# Patient Record
Sex: Male | Born: 1935 | Race: Black or African American | Hispanic: No | Smoking: Former smoker
Health system: Southern US, Community
[De-identification: ages and names within clinical notes are randomized; demographics above are authoritative.]

## PROBLEM LIST (undated history)

## (undated) DIAGNOSIS — E785 Hyperlipidemia, unspecified: Secondary | ICD-10-CM

## (undated) DIAGNOSIS — E119 Type 2 diabetes mellitus without complications: Secondary | ICD-10-CM

## (undated) DIAGNOSIS — I5032 Chronic diastolic (congestive) heart failure: Secondary | ICD-10-CM

## (undated) DIAGNOSIS — I10 Essential (primary) hypertension: Secondary | ICD-10-CM

## (undated) DIAGNOSIS — N183 Chronic kidney disease, stage 3 unspecified: Secondary | ICD-10-CM

## (undated) DIAGNOSIS — I739 Peripheral vascular disease, unspecified: Secondary | ICD-10-CM

## (undated) DIAGNOSIS — C140 Malignant neoplasm of pharynx, unspecified: Secondary | ICD-10-CM

## (undated) DIAGNOSIS — I2510 Atherosclerotic heart disease of native coronary artery without angina pectoris: Secondary | ICD-10-CM

## (undated) HISTORY — DX: Peripheral vascular disease, unspecified: I73.9

## (undated) HISTORY — DX: Type 2 diabetes mellitus without complications: E11.9

## (undated) HISTORY — DX: Essential (primary) hypertension: I10

## (undated) HISTORY — PX: THROAT SURGERY: SHX803

## (undated) HISTORY — DX: Malignant neoplasm of pharynx, unspecified: C14.0

## (undated) HISTORY — DX: Atherosclerotic heart disease of native coronary artery without angina pectoris: I25.10

## (undated) HISTORY — DX: Hyperlipidemia, unspecified: E78.5

---

## 2002-02-01 ENCOUNTER — Encounter: Payer: Self-pay | Admitting: *Deleted

## 2002-02-01 ENCOUNTER — Ambulatory Visit (HOSPITAL_COMMUNITY): Admission: RE | Admit: 2002-02-01 | Discharge: 2002-02-01 | Payer: Self-pay | Admitting: Family Medicine

## 2002-02-01 ENCOUNTER — Inpatient Hospital Stay (HOSPITAL_COMMUNITY): Admission: EM | Admit: 2002-02-01 | Discharge: 2002-02-15 | Payer: Self-pay | Admitting: *Deleted

## 2002-02-01 ENCOUNTER — Encounter: Payer: Self-pay | Admitting: Family Medicine

## 2002-02-03 ENCOUNTER — Encounter: Payer: Self-pay | Admitting: Family Medicine

## 2002-02-07 ENCOUNTER — Encounter: Payer: Self-pay | Admitting: General Surgery

## 2002-02-12 ENCOUNTER — Encounter: Payer: Self-pay | Admitting: General Surgery

## 2002-09-26 ENCOUNTER — Ambulatory Visit (HOSPITAL_COMMUNITY): Admission: RE | Admit: 2002-09-26 | Discharge: 2002-09-26 | Payer: Self-pay | Admitting: Family Medicine

## 2002-09-26 ENCOUNTER — Encounter: Payer: Self-pay | Admitting: Family Medicine

## 2002-09-27 ENCOUNTER — Encounter (HOSPITAL_COMMUNITY): Admission: RE | Admit: 2002-09-27 | Discharge: 2002-10-27 | Payer: Self-pay | Admitting: Family Medicine

## 2002-09-29 ENCOUNTER — Encounter: Payer: Self-pay | Admitting: Vascular Surgery

## 2002-10-03 ENCOUNTER — Inpatient Hospital Stay (HOSPITAL_COMMUNITY): Admission: RE | Admit: 2002-10-03 | Discharge: 2002-10-04 | Payer: Self-pay | Admitting: Vascular Surgery

## 2002-10-03 ENCOUNTER — Encounter (INDEPENDENT_AMBULATORY_CARE_PROVIDER_SITE_OTHER): Payer: Self-pay | Admitting: *Deleted

## 2002-11-03 ENCOUNTER — Ambulatory Visit (HOSPITAL_COMMUNITY): Admission: RE | Admit: 2002-11-03 | Discharge: 2002-11-03 | Payer: Self-pay | Admitting: Family Medicine

## 2003-03-22 ENCOUNTER — Ambulatory Visit (HOSPITAL_COMMUNITY): Admission: RE | Admit: 2003-03-22 | Discharge: 2003-03-22 | Payer: Self-pay | Admitting: *Deleted

## 2004-05-09 ENCOUNTER — Inpatient Hospital Stay (HOSPITAL_COMMUNITY): Admission: EM | Admit: 2004-05-09 | Discharge: 2004-05-10 | Payer: Self-pay | Admitting: Internal Medicine

## 2004-05-09 ENCOUNTER — Ambulatory Visit: Payer: Self-pay | Admitting: Cardiology

## 2004-05-09 ENCOUNTER — Emergency Department (HOSPITAL_COMMUNITY): Admission: EM | Admit: 2004-05-09 | Discharge: 2004-05-09 | Payer: Self-pay | Admitting: Emergency Medicine

## 2004-05-14 ENCOUNTER — Ambulatory Visit: Payer: Self-pay

## 2004-05-29 ENCOUNTER — Ambulatory Visit: Payer: Self-pay | Admitting: *Deleted

## 2005-04-02 ENCOUNTER — Ambulatory Visit: Payer: Self-pay | Admitting: *Deleted

## 2005-07-21 ENCOUNTER — Encounter (INDEPENDENT_AMBULATORY_CARE_PROVIDER_SITE_OTHER): Payer: Self-pay | Admitting: *Deleted

## 2005-07-21 LAB — CONVERTED CEMR LAB
Cholesterol: 123 mg/dL
HDL: 32 mg/dL
Triglycerides: 82 mg/dL

## 2006-12-08 ENCOUNTER — Ambulatory Visit: Payer: Self-pay | Admitting: Cardiovascular Disease

## 2006-12-09 ENCOUNTER — Ambulatory Visit (HOSPITAL_COMMUNITY): Admission: RE | Admit: 2006-12-09 | Discharge: 2006-12-09 | Payer: Self-pay | Admitting: Cardiovascular Disease

## 2008-06-13 ENCOUNTER — Ambulatory Visit: Payer: Self-pay | Admitting: Cardiology

## 2008-06-13 ENCOUNTER — Encounter (INDEPENDENT_AMBULATORY_CARE_PROVIDER_SITE_OTHER): Payer: Self-pay | Admitting: *Deleted

## 2008-06-13 ENCOUNTER — Inpatient Hospital Stay (HOSPITAL_COMMUNITY): Admission: AD | Admit: 2008-06-13 | Discharge: 2008-06-15 | Payer: Self-pay | Admitting: Family Medicine

## 2008-06-13 LAB — CONVERTED CEMR LAB: Hgb A1c MFr Bld: 11.6 %

## 2008-10-31 ENCOUNTER — Inpatient Hospital Stay (HOSPITAL_COMMUNITY): Admission: AD | Admit: 2008-10-31 | Discharge: 2008-11-02 | Payer: Self-pay | Admitting: Family Medicine

## 2008-10-31 ENCOUNTER — Ambulatory Visit: Payer: Self-pay | Admitting: Cardiology

## 2008-10-31 ENCOUNTER — Encounter (INDEPENDENT_AMBULATORY_CARE_PROVIDER_SITE_OTHER): Payer: Self-pay | Admitting: *Deleted

## 2008-10-31 LAB — CONVERTED CEMR LAB
BUN: 22 mg/dL
CO2: 32 meq/L
Calcium: 10.4 mg/dL
Chloride: 103 meq/L
Creatinine, Ser: 1.09 mg/dL
GFR calc non Af Amer: >60 mL/min
Glomerular Filtration Rate, Af Am: >60 mL/min/{1.73_m2}
Glucose, Bld: 102 mg/dL
HCT: 43 %
Hemoglobin: 5.14 g/dL
INR: 0.9
MCV: 83.5 fL
Platelets: 14 10*3/uL
Potassium: 4.2 meq/L
Prothrombin Time: 12.7 s
Sodium: 139 meq/L
WBC: 8 10*3/uL

## 2008-11-01 ENCOUNTER — Other Ambulatory Visit: Payer: Self-pay | Admitting: Cardiology

## 2008-11-01 ENCOUNTER — Encounter (INDEPENDENT_AMBULATORY_CARE_PROVIDER_SITE_OTHER): Payer: Self-pay | Admitting: *Deleted

## 2008-11-01 ENCOUNTER — Ambulatory Visit: Payer: Self-pay | Admitting: Cardiology

## 2008-11-01 ENCOUNTER — Encounter: Payer: Self-pay | Admitting: Cardiology

## 2008-11-01 LAB — CONVERTED CEMR LAB
HCT: 38 %
Hemoglobin: 13.4 g/dL
MCV: 83 fL
WBC: 7.4 10*3/uL
aPTT: 35 s

## 2008-11-02 ENCOUNTER — Other Ambulatory Visit: Payer: Self-pay | Admitting: Cardiology

## 2008-11-02 ENCOUNTER — Encounter (INDEPENDENT_AMBULATORY_CARE_PROVIDER_SITE_OTHER): Payer: Self-pay | Admitting: *Deleted

## 2008-11-02 LAB — CONVERTED CEMR LAB
BUN: 15 mg/dL
CO2: 29 meq/L
Calcium: 9.1 mg/dL
Chloride: 105 meq/L
Creatinine, Ser: 1.17 mg/dL
GFR calc non Af Amer: >60 mL/min
Glomerular Filtration Rate, Af Am: >60 mL/min/{1.73_m2}
Glucose, Bld: 57 mg/dL
HCT: 39.9 %
Hemoglobin: 13.5 g/dL
INR: 1
MCV: 84.3 fL
Platelets: 154 10*3/uL
Potassium: 3.4 meq/L
Prothrombin Time: 13.5 s
Sodium: 140 meq/L
WBC: 7.8 10*3/uL
aPTT: 38 s

## 2008-11-27 ENCOUNTER — Encounter (HOSPITAL_COMMUNITY): Admission: RE | Admit: 2008-11-27 | Discharge: 2008-12-27 | Payer: Self-pay | Admitting: Cardiology

## 2008-12-01 ENCOUNTER — Telehealth: Payer: Self-pay | Admitting: Cardiology

## 2008-12-05 ENCOUNTER — Encounter: Payer: Self-pay | Admitting: Cardiology

## 2008-12-18 ENCOUNTER — Telehealth: Payer: Self-pay | Admitting: Cardiology

## 2008-12-27 ENCOUNTER — Encounter (HOSPITAL_COMMUNITY): Admission: RE | Admit: 2008-12-27 | Discharge: 2009-01-18 | Payer: Self-pay | Admitting: Cardiovascular Disease

## 2008-12-28 ENCOUNTER — Ambulatory Visit: Payer: Self-pay | Admitting: Cardiovascular Disease

## 2008-12-29 ENCOUNTER — Encounter: Payer: Self-pay | Admitting: Cardiology

## 2009-01-08 ENCOUNTER — Encounter: Payer: Self-pay | Admitting: Cardiology

## 2009-01-09 ENCOUNTER — Ambulatory Visit (HOSPITAL_COMMUNITY): Admission: RE | Admit: 2009-01-09 | Discharge: 2009-01-09 | Payer: Self-pay | Admitting: Cardiovascular Disease

## 2009-01-11 ENCOUNTER — Ambulatory Visit: Payer: Self-pay | Admitting: Cardiology

## 2009-01-13 ENCOUNTER — Encounter: Payer: Self-pay | Admitting: Cardiology

## 2009-01-13 LAB — CONVERTED CEMR LAB
Cholesterol: 133 mg/dL (ref 0–200)
HDL: 35 mg/dL — ABNORMAL LOW (ref >39)
LDL Cholesterol: 80 mg/dL (ref 0–99)
Total CHOL/HDL Ratio: 3.8
Triglycerides: 92 mg/dL (ref <150)
VLDL: 18 mg/dL (ref 0–40)

## 2009-01-16 ENCOUNTER — Encounter (INDEPENDENT_AMBULATORY_CARE_PROVIDER_SITE_OTHER): Payer: Self-pay | Admitting: *Deleted

## 2009-01-19 ENCOUNTER — Encounter (HOSPITAL_COMMUNITY): Admission: RE | Admit: 2009-01-19 | Discharge: 2009-02-18 | Payer: Self-pay | Admitting: Cardiovascular Disease

## 2009-02-19 ENCOUNTER — Encounter (HOSPITAL_COMMUNITY): Admission: RE | Admit: 2009-02-19 | Discharge: 2009-03-21 | Payer: Self-pay | Admitting: Cardiovascular Disease

## 2009-06-12 ENCOUNTER — Ambulatory Visit (HOSPITAL_COMMUNITY): Admission: RE | Admit: 2009-06-12 | Discharge: 2009-06-12 | Payer: Self-pay | Admitting: Family Medicine

## 2009-07-30 DIAGNOSIS — I10 Essential (primary) hypertension: Secondary | ICD-10-CM | POA: Insufficient documentation

## 2009-07-30 DIAGNOSIS — I739 Peripheral vascular disease, unspecified: Secondary | ICD-10-CM

## 2009-07-30 DIAGNOSIS — E119 Type 2 diabetes mellitus without complications: Secondary | ICD-10-CM

## 2009-07-30 DIAGNOSIS — E785 Hyperlipidemia, unspecified: Secondary | ICD-10-CM

## 2009-07-30 DIAGNOSIS — I2510 Atherosclerotic heart disease of native coronary artery without angina pectoris: Secondary | ICD-10-CM | POA: Insufficient documentation

## 2009-07-30 DIAGNOSIS — C329 Malignant neoplasm of larynx, unspecified: Secondary | ICD-10-CM | POA: Insufficient documentation

## 2009-07-31 ENCOUNTER — Encounter (INDEPENDENT_AMBULATORY_CARE_PROVIDER_SITE_OTHER): Payer: Self-pay | Admitting: *Deleted

## 2009-07-31 ENCOUNTER — Ambulatory Visit: Payer: Self-pay | Admitting: Cardiology

## 2009-11-15 ENCOUNTER — Encounter (INDEPENDENT_AMBULATORY_CARE_PROVIDER_SITE_OTHER): Payer: Self-pay | Admitting: *Deleted

## 2009-11-15 LAB — CONVERTED CEMR LAB
ALT: 17 units/L
ALT: 17 units/L
ALT: 17 units/L
AST: 19 units/L
AST: 19 units/L
AST: 19 units/L
Albumin: 4.1 g/dL
Albumin: 4.1 g/dL
Albumin: 4.1 g/dL
Alkaline Phosphatase: 35 units/L
Alkaline Phosphatase: 35 units/L
Alkaline Phosphatase: 35 units/L
Bilirubin, Direct: 0.2 mg/dL
Bilirubin, Direct: 0.2 mg/dL
Bilirubin, Direct: 0.2 mg/dL
Cholesterol: 111 mg/dL
Cholesterol: 111 mg/dL
Cholesterol: 111 mg/dL
HDL: 34 mg/dL
HDL: 34 mg/dL
HDL: 34 mg/dL
LDL Cholesterol: 61 mg/dL
LDL Cholesterol: 61 mg/dL
LDL Cholesterol: 61 mg/dL
Total Protein: 6.4 g/dL
Total Protein: 6.4 g/dL
Total Protein: 6.4 g/dL
Triglycerides: 82 mg/dL
Triglycerides: 82 mg/dL
Triglycerides: 82 mg/dL

## 2010-01-29 ENCOUNTER — Encounter (INDEPENDENT_AMBULATORY_CARE_PROVIDER_SITE_OTHER): Payer: Self-pay | Admitting: *Deleted

## 2010-02-05 ENCOUNTER — Ambulatory Visit: Payer: Self-pay | Admitting: Cardiology

## 2010-02-05 ENCOUNTER — Encounter (INDEPENDENT_AMBULATORY_CARE_PROVIDER_SITE_OTHER): Payer: Self-pay | Admitting: *Deleted

## 2010-02-23 ENCOUNTER — Emergency Department (HOSPITAL_COMMUNITY): Admission: EM | Admit: 2010-02-23 | Discharge: 2010-02-23 | Payer: Self-pay | Admitting: Emergency Medicine

## 2010-03-07 ENCOUNTER — Encounter (INDEPENDENT_AMBULATORY_CARE_PROVIDER_SITE_OTHER): Payer: Self-pay | Admitting: *Deleted

## 2010-03-07 ENCOUNTER — Ambulatory Visit: Payer: Self-pay | Admitting: Cardiology

## 2010-05-06 ENCOUNTER — Encounter (INDEPENDENT_AMBULATORY_CARE_PROVIDER_SITE_OTHER): Payer: Self-pay | Admitting: *Deleted

## 2010-05-09 ENCOUNTER — Encounter (INDEPENDENT_AMBULATORY_CARE_PROVIDER_SITE_OTHER): Payer: Self-pay | Admitting: *Deleted

## 2010-05-09 ENCOUNTER — Ambulatory Visit: Admit: 2010-05-09 | Payer: Self-pay | Admitting: Cardiology

## 2010-05-13 ENCOUNTER — Encounter: Payer: Self-pay | Admitting: Cardiovascular Disease

## 2010-05-14 ENCOUNTER — Ambulatory Visit
Admission: RE | Admit: 2010-05-14 | Discharge: 2010-05-14 | Payer: Self-pay | Source: Home / Self Care | Attending: Cardiology | Admitting: Cardiology

## 2010-05-14 ENCOUNTER — Encounter: Payer: Self-pay | Admitting: Adult Health

## 2010-05-21 ENCOUNTER — Ambulatory Visit
Admission: RE | Admit: 2010-05-21 | Discharge: 2010-05-21 | Payer: Self-pay | Source: Home / Self Care | Attending: Cardiology | Admitting: Cardiology

## 2010-05-21 NOTE — Letter (Signed)
Summary: [place] [facility], [facility] at [employer]  [address] [place] [address]   Phone: [phone]  Fax: [phone]     February 05, 2010 MRN: [id number]   [name] [address] [place], [place]  [address]      YOUR LAB WORK IS DUE   March 11, 2010  Please go to [facility], located across the street from [facility] on the second floor.  Hours are Monday - Friday 7am until 7:30pm         Saturday 8am until 12noon      DO NOT EAT OR DRINK AFTER MIDNIGHT EVENING PRIOR TO LABWORK

## 2010-05-21 NOTE — Miscellaneous (Signed)
Summary: labs lipids,liver,11/15/2009  Clinical Lists Changes  Observations: Added new observation of ALBUMIN: 4.1 g/dL (16/01/9603 54:09) Added new observation of PROTEIN, TOT: 6.4 g/dL (81/19/1478 29:56) Added new observation of SGPT (ALT): 17 units/L (11/15/2009 10:35) Added new observation of SGOT (AST): 19 units/L (11/15/2009 10:35) Added new observation of ALK PHOS: 35 units/L (11/15/2009 10:35) Added new observation of BILI DIRECT: 0.2 mg/dL (21/30/8657 84:69) Added new observation of LDL: 61 mg/dL (62/95/2841 32:44) Added new observation of HDL: 34 mg/dL (04/23/7251 66:44) Added new observation of TRIGLYC TOT: 82 mg/dL (03/47/4259 56:38) Added new observation of CHOLESTEROL: 111 mg/dL (75/64/3329 51:88)

## 2010-05-21 NOTE — Assessment & Plan Note (Signed)
Summary: 6 mth f/u per checkout on 01/11/09/tg   Visit Type:  Follow-up Primary Provider:  Dr. Lubertha South   History of Present Illness: [name] returns to the office for continued assessment and treatment of coronary disease and multiple cardiovascular risk factors.  Since his last appointment, he has done generally well.  He denies dyspnea or chest discomfort.  There has been no orthopnea, PND nor pedal edema.  I note that he has a hoarse voice during this visit.  He reports that that is chronic and intermittent.  He has a history of laryngeal carcinoma treated with radiation therapy, but has not recently been reevaluated by an ENT physician.  He does not take his blood pressure routinely, but occasionally measures it at a drugstore with values typically around 130 systolic.  Clinical Review Panels:  Lab Work HgbA1c 11.6 (06/13/2008) BUN 15 (11/02/2008) Creatinine 1.17 (11/02/2008)  Lipid Levels LDL 80 (01/13/2009) HDL 35 (01/13/2009) Cholesterol 133 (01/13/2009)    Current Medications (verified): 1)  Plavix 75 Mg Tabs (Clopidogrel Bisulfate) .... Take 1 Tab Daily 2)  Simvastatin 40 Mg Tabs (Simvastatin) .... Take 1 Tab Daily 3)  Aspir-Low 81 Mg Tbec (Aspirin) .... Take 1 Tab Daily 4)  Glucophage 500 Mg Tabs (Metformin Hcl) .... Take 2 Tabs Two Times A Day 5)  Glipizide 10 Mg Tabs (Glipizide) .... Take 1 Tab Two Times A Day 6)  Hydrochlorothiazide 25 Mg Tabs (Hydrochlorothiazide) .... Take 1 Tab Daily 7)  Lisinopril 40 Mg Tabs (Lisinopril) .... Take One Tablet By Mouth Daily 8)  Carvedilol 12.5 Mg Tabs (Carvedilol) .... Take 2  Tablet By Mouth Twice A Day  Past History:  PMH, FH, and Social History reviewed and updated.  Review of Systems       The patient complains of hoarseness.  The patient denies weight loss, weight gain, vision loss, decreased hearing, chest pain, syncope, dyspnea on exertion, peripheral edema, prolonged cough, headaches, hemoptysis,  abdominal pain, and melena.    Vital Signs:  Patient profile:   77 year old male Weight:      171 pounds Pulse rate:   81 / minute BP sitting:   185 / 89  (right arm)  Vitals Entered By: Dreama Saa, CNA (July 31, 2009 11:34 AM)  Serial Vital Signs/Assessments:  Time      Position  BP       Pulse  Resp  Temp     By           R Arm     185/90                         Kathlen Brunswick, MD, [facility]           L Arm     170/90                         Kathlen Brunswick, MD, [facility]   Physical Exam  General:    well developed; no acute distress; proportionate weight and height HEENT-poor dentition; hoarse voice   Neck-No JVD; no carotid bruits: Lungs-No tachypnea, no rales; no rhonchi; no wheezes: Cardiovascular-normal PMI; normal S1 and S2; S4 present Abdomen-BS normal; soft and non-tender without masses or organomegaly:  Neurologic-Normal cranial nerves; symmetric strength and tone:  Skin-Warm, no significant lesions: Extremities- distal pulses intact; no edema:     Impression & Recommendations:  Problem # 1:  ATHEROSCLEROTIC CARDIOVASCULAR DISEASE (  ICD-429.2) No symptoms to suggest recurrent myocardial ischemia.  Plavix will be continued for at least another 15 months.  Problem # 2:  HYPERTENSION (ICD-401.9) Blood pressure is now quite high despite a normal value on the same medication 6 months ago.  Patient brought medications with him and he assures me that he has been taking them as ordered.  We will increase the dose of lisinopril and Coreg, have him obtain blood pressures in his drugstore and return in one month for repeat blood pressure check.  Problem # 3:  DIABETES MELLITUS, TYPE II (ICD-250.00) Last lipid profile was good; current medication will be continued.  The most recent A1c level available to me is quite elevated.  Dr. Gerda Diss is managing his diabetic medications.  Problem # 4:  CARCINOMA, LARYNX (ICD-161.9) I suspect that his hoarseness is related to his original  neoplasm and subsequent therapy, but it might be worthwhile to have him reassessed by his ENT specialist.  I will leave that decision to Dr. Gerda Diss and plan to see this nice gentleman again in 6 months.  Patient Instructions: 1)  Your physician recommends that you schedule a follow-up appointment in: 6 MONTHS 2)  Your physician recommends that you return for lab work in: 1 MONTH 3)  Your physician has recommended you make the following change in your medication:  INCREASE CARVEDILOL TO 25MG  TWICE DAILY, INCREASE LISINOPRIL TO 40MG  DAILY 4)  You have been referred to NURSE VISIT IN 1 MONTH 5)  Your physician has requested that you regularly monitor and record your blood pressure readings at home.  Please use the same machine at the same time of day to check your readings and record them to bring to your follow-up visit. CHECK BLOOD PRESSURES AT THE DRUG STORE AND RECORD ON YOUR BLOOD PRESSURE DIARY AND BRING TO NURSE VIST Prescriptions: LISINOPRIL 40 MG TABS (LISINOPRIL) Take one tablet by mouth daily  #30 x 6   Entered by:   Teressa Lower RN   Authorized by:   Kathlen Brunswick, MD, [facility]   Signed by:   Teressa Lower RN on 07/31/2009   Method used:   Electronically to        [facility] [phone]* (retail)       [address]       [place], [place]  [address]       Ph: [phone] or [phone]       Fax: [phone]   RxID:   [id number]

## 2010-05-21 NOTE — Letter (Signed)
Summary: [place] [facility], agricultural at [employer]  [address] [place] [address]   Phone: [phone]  Fax: [phone]     March 07, 2010 MRN: [id number]   Merle Haile [address] [place], [place]  [address]      YOUR LAB WORK IS DUE   April 08, 2010  Please go to [facility], located across the street from [facility] on the second floor.  Hours are Monday - Friday 7am until 7:30pm         Saturday 8am until 12noon    _X_  DO NOT EAT OR DRINK AFTER MIDNIGHT EVENING PRIOR TO LABWORK

## 2010-05-21 NOTE — Assessment & Plan Note (Signed)
Summary: 1 mth nurse visit per checkout on 02/05/10/tg  Nurse Visit   Vital Signs:  Patient profile:   77 year old male Height:      70 inches Weight:      173 pounds O2 Sat:      95 % on Room air Temp:     97.7 degrees F oral Pulse rate:   75 / minute BP sitting:   178 / 82  (right arm)  Vitals Entered By: Teressa Lower RN (March 07, 2010 10:26 AM)  O2 Flow:  Room air  Visit Type:  1 month nurse visit Primary Provider:  Dr. Lubertha South   History of Present Illness: S: 1 month nurse visit B: ov on 02/05/2010, pt started carvedilol 12.5mg  two times a day and was to stop simvastatin and start lipitior A: pt did not stop simvastatin and begin lipitor, I sent new rx for lipitor to [facility], took simvastatin from pt, gave new lab orders for december 19th due to not started meds R: asked pt to return tomorrow on meds for a repeat bp check  Noted.  [name], M.D.    Serial Vital Signs/Assessments:  Time      Position  BP       Pulse  Resp  Temp     By 10:32 AM  R Arm     190/89                         Teressa Lower RN 10:32 AM  L Arm     200/79                         Teressa Lower RN 8:49 AM   R Arm     182/82                         Teressa Lower RN 8:49 AM   L Arm     181/88                         Teressa Lower RN  Comments: pt denies complaints but did not take his am meds By: Teressa Lower RN  8:49 AM [name] came to office 03/08/2010 for a repeat bp check on his medication the readings are as listed without  much improvement, pt continues to deny complaints By: Teressa Lower RN    Current Medications (verified): 1)  Plavix 75 Mg Tabs (Clopidogrel Bisulfate) .... Take 1 Tab Daily 2)  Aspir-Low 81 Mg Tbec (Aspirin) .... Take 1 Tab Daily 3)  Glucophage 500 Mg Tabs (Metformin Hcl) .... Take 2 Tabs Two Times A Day 4)  Glipizide 10 Mg Tabs (Glipizide) .... Take 1 Tab Two Times A Day 5)  Hydrochlorothiazide 25 Mg Tabs (Hydrochlorothiazide) .... Take 1 Tab  Daily 6)  Lisinopril 40 Mg Tabs (Lisinopril) .... Take One Tablet By Mouth Daily 7)  Carvedilol 12.5 Mg Tabs (Carvedilol) .... Take One Tablet By Mouth Twice A Day 8)  Lipitor 40 Mg Tabs (Atorvastatin Calcium) .... Take 1 Tablet By Mouth Once A Day  Allergies (verified): No Known Drug Allergies  Patient Instructions: 1)  Your physician recommends that you schedule a follow-up appointment in: May 09, 2010 11:30AM 2)  Your physician recommends that you return for lab work in: April 08, 2010 3)  Your physician has recommended you make the following change  in your medication:  STOP SIMVASTATIN AND BEGIN LIPITOR DAILY  Prescriptions: LIPITOR 40 MG TABS (ATORVASTATIN CALCIUM) Take 1 tablet by mouth once a day  #30 x 3   Entered by:   Teressa Lower RN   Authorized by:   Kathlen Brunswick, MD, [facility]   Signed by:   Teressa Lower RN on 03/07/2010   Method used:   Electronically to        [facility] [phone]* (retail)       [address]       [place], [place]  [address]       Ph: [phone] or [phone]       Fax: [phone]   RxID:   [id number]

## 2010-05-21 NOTE — Miscellaneous (Signed)
Summary: labs 10/31/08-11/02/2008   Clinical Lists Changes  Observations: Added new observation of CALCIUM: 9.1 mg/dL (96/07/5407 8:11) Added new observation of GFR AA: >60 mL/min/1.35m2 (11/02/2008 8:40) Added new observation of GFR: >60 mL/min (11/02/2008 8:40) Added new observation of CREATININE: 1.17 mg/dL (91/47/8295 6:21) Added new observation of BUN: 15 mg/dL (30/86/5784 6:96) Added new observation of BG RANDOM: 57 mg/dL (29/52/8413 2:44) Added new observation of CO2 PLSM/SER: 29 meq/L (11/02/2008 8:40) Added new observation of CL SERUM: 105 meq/L (11/02/2008 8:40) Added new observation of K SERUM: 3.4 meq/L (11/02/2008 8:40) Added new observation of NA: 140 meq/L (11/02/2008 8:40) Added new observation of PLATELETK/UL: 154 K/uL (11/02/2008 8:40) Added new observation of MCV: 84.3 fL (11/02/2008 8:40) Added new observation of HCT: 39.9 % (11/02/2008 8:40) Added new observation of HGB: 13.5 g/dL (04/23/7251 6:64) Added new observation of WBC COUNT: 7.8 10*3/microliter (11/02/2008 8:40) Added new observation of INR: 1.0  (11/02/2008 8:40) Added new observation of PT PATIENT: 13.5 s (11/02/2008 8:40) Added new observation of PTT PATIENT: 38 s (11/02/2008 8:40) Added new observation of MCV: 83.0 fL (11/01/2008 8:40) Added new observation of HCT: 38.0 % (11/01/2008 8:40) Added new observation of HGB: 13.4 g/dL (40/34/7425 9:56) Added new observation of WBC COUNT: 7.4 10*3/microliter (11/01/2008 8:40) Added new observation of PTT PATIENT: 35 s (11/01/2008 8:40) Added new observation of CALCIUM: 10.4 mg/dL (38/75/6433 2:95) Added new observation of GFR AA: >60 mL/min/1.41m2 (10/31/2008 8:40) Added new observation of GFR: >60 mL/min (10/31/2008 8:40) Added new observation of CREATININE: 1.09 mg/dL (18/84/1660 6:30) Added new observation of BUN: 22 mg/dL (16/04/930 3:55) Added new observation of BG RANDOM: 102 mg/dL (73/22/0254 2:70) Added new observation of CO2 PLSM/SER: 32 meq/L  (10/31/2008 8:40) Added new observation of CL SERUM: 103 meq/L (10/31/2008 8:40) Added new observation of K SERUM: 4.2 meq/L (10/31/2008 8:40) Added new observation of NA: 139 meq/L (10/31/2008 8:40) Added new observation of PLATELETK/UL: 14.0 K/uL (10/31/2008 8:40) Added new observation of MCV: 83.5 fL (10/31/2008 8:40) Added new observation of HCT: 43.0 % (10/31/2008 8:40) Added new observation of HGB: 5.14 g/dL (62/37/6283 1:51) Added new observation of WBC COUNT: 8.0 10*3/microliter (10/31/2008 8:40) Added new observation of INR: 0.9  (10/31/2008 8:40) Added new observation of PT PATIENT: 12.7 s (10/31/2008 8:40) Added new observation of HGBA1C: 11.6 % (06/13/2008 8:40) Added new observation of HDL: 32 mg/dL (76/16/0737 1:06) Added new observation of TRIGLYC TOT: 82 mg/dL (26/94/8546 2:70) Added new observation of CHOLESTEROL: 123 mg/dL (35/00/9381 8:29)

## 2010-05-21 NOTE — Assessment & Plan Note (Signed)
Summary: Z6X   Visit Type:  Follow-up Primary Provider:  Dr. Lubertha South   History of Present Illness: [name] returns to the office for continued assessment and treatment of coronary disease and multiple cardiovascular risk factors.  He continues to do well, especially from a cardiovascular standpoint, with no dyspnea, no chest discomfort, no orthopnea, no PND, no lightheadedness and no syncope.  He has not been hospitalized or required emergency department therapy.  He denies any new medical conditions.  He has had some challenges with medication.  His dose of simvastatin was increased from 40 to 80 mg, but he is taking pills from both bottles resulting in a dose of 120 mg per day.  As a result, his most recent lipid profile was excellent; however, dosage at this level cannot continue.  He has not been monitoring his blood pressure.  Current Medications (verified): 1)  Plavix 75 Mg Tabs (Clopidogrel Bisulfate) .... Take 1 Tab Daily 2)  Aspir-Low 81 Mg Tbec (Aspirin) .... Take 1 Tab Daily 3)  Glucophage 500 Mg Tabs (Metformin Hcl) .... Take 2 Tabs Two Times A Day 4)  Glipizide 10 Mg Tabs (Glipizide) .... Take 1 Tab Two Times A Day 5)  Hydrochlorothiazide 25 Mg Tabs (Hydrochlorothiazide) .... Take 1 Tab Daily 6)  Lisinopril 40 Mg Tabs (Lisinopril) .... Take One Tablet By Mouth Daily 7)  Carvedilol 12.5 Mg Tabs (Carvedilol) .... Take One Tablet By Mouth Twice A Day 8)  Lipitor 40 Mg Tabs (Atorvastatin Calcium) .... Take 1 Tablet By Mouth Once A Day  Allergies (verified): No Known Drug Allergies  Comments:  Nurse/Medical Assistant: patient brought med bottles he is taking simvastatin 40 mg and 80  didn't realize they were the same med called Dr.luking about patients meds per Dr.Steve luking he was increased to 80 mg in july not suppose to be taking both   Past History:  PMH, FH, and Social History reviewed and updated.  Review of Systems       See history of  present illness.  Vital Signs:  Patient profile:   77 year old male Weight:      174 pounds BMI:     25.06 Pulse rate:   89 / minute BP sitting:   186 / 92  (right arm)  Vitals Entered By: Dreama Saa, CNA (February 05, 2010 2:12 PM)  Physical Exam  General:    well developed; no acute distress; proportionate weight and height HEENT-poor dentition   Neck-No JVD; no carotid bruits: Lungs-No tachypnea, no rales; no rhonchi; no wheezes: Cardiovascular-normal PMI; normal S1 and S2; S4 present Abdomen-BS normal; soft and non-tender without masses or organomegaly:  Neurologic-Normal cranial nerves; symmetric strength and tone:  Skin-Warm, no significant lesions: Extremities- distal pulses intact; no edema:     Impression & Recommendations:  Problem # 1:  ATHEROSCLEROTIC CARDIOVASCULAR DISEASE (ICD-429.2) Patient continues to be free of symptoms, suggesting that he is experiencing no myocardial ischemia.  Major direction of his cardiology care will be to optimally control cardiovascular risk factors.  Problem # 2:  HYPERTENSION (ICD-401.9) Blood pressure control is inadequate but has beenat most of his previous office visits.  Carvedilol 12.5 mg b.i.d. will be added to his medical regime with the dosage adjusted upwards as required.  He is also being treated with hydrochlorothiazide and lisinopril.  A fourth drug may well be necessary with a calcium channel antagonists the likely choice.  Problem # 3:  HYPERLIPIDEMIA (ICD-272.4) Continued use of simvastatin would exclude  calcium channel antagonists for treatment of his hypertension.  Accordingly, we will substitute atorvastatin 40 mg q.d. and repeat a lipid profile in one month.  He will be seen by the cardiology nurses in one month and by me in 3 months to verify adequate control of his hypertension.  Other Orders: Future Orders: T-Lipid Profile ([id number]) ... 03/11/2010 T-Comprehensive Metabolic Panel [id number]) ...  03/11/2010  Patient Instructions: 1)  Your physician recommends that you schedule a follow-up appointment in: 3 months 2)  Your physician recommends that you return for lab work in: 1 month 3)  Your physician has recommended you make the following change in your medication: carvedilol 12.5mg  two times a day, stop simvastatin start atorvastatin 40mg  daily 4)  You have been referred to nurse visit in 1 month with labwork  Prescriptions: LIPITOR 40 MG TABS (ATORVASTATIN CALCIUM) Take 1 tablet by mouth once a day  #30 x 3   Entered by:   Teressa Lower RN   Authorized by:   Kathlen Brunswick, MD, [facility]   Signed by:   Teressa Lower RN on 02/05/2010   Method used:   Electronically to        [facility] [phone]* (retail)       [address]       [place], [place]  [address]       Ph: [phone] or [phone]       Fax: [phone]   RxID:   [id number] CARVEDILOL 12.5 MG TABS (CARVEDILOL) Take one tablet by mouth twice a day  #60 x 3   Entered by:   Teressa Lower RN   Authorized by:   Kathlen Brunswick, MD, [facility]   Signed by:   Teressa Lower RN on 02/05/2010   Method used:   Electronically to        [facility] [phone]* (retail)       [address]       [place], [place]  [address]       Ph: [phone] or [phone]       Fax: [phone]   RxID:   [id number]

## 2010-05-23 NOTE — Letter (Signed)
Summary: Appointment - Missed  [facility] [place]  [address], [place] [address]   Phone: [phone]  Fax: [phone]     May 09, 2010 MRN: [id number]   [facility] [address] [place], [place]  [address]   Dear [name],  Our records indicate you missed your appointment on         05/09/10               with Dr.       .    ROTHBART                               It is very important that we reach you to reschedule this appointment. We look forward to participating in your health care needs. Please contact us at the number listed above at your earliest convenience to reschedule this appointment.     Sincerely,    Glass blower/designer

## 2010-05-23 NOTE — Assessment & Plan Note (Signed)
Summary: 3 MTH F/U/TG   Visit Type:  Follow-up Primary Provider:  Dr. Lubertha South   History of Present Illness: 3 mth fu patients bp is 199/99 he states he has took meds this morning but has not eat  This is a 77 year old African American married male patient who returns here for followup of hypertension. His carvedilol was increased back in October by Dr. Dietrich Pates.Followup nurse visit in November showed his blood pressure was still elevated at 180-200 systolic range but the patient had not taken his medications that day.  The patient comes in today without cardiac complaints he denies chest pain, palpitations, dyspnea, dyspnea on exertion, dizziness, or presyncope. He does not have a way to check his blood pressure home. He does admit to eating a lot of salt. He uses a salt shaker and just about everything and needs quite a bit of canned foods.     Current Medications (verified): 1)  Plavix 75 Mg Tabs (Clopidogrel Bisulfate) .... Take 1 Tab Daily 2)  Aspir-Low 81 Mg Tbec (Aspirin) .... Take 1 Tab Daily 3)  Glucophage 500 Mg Tabs (Metformin Hcl) .... Take 2 Tabs Two Times A Day 4)  Glipizide 10 Mg Tabs (Glipizide) .... Take 1 Tab Two Times A Day 5)  Hydrochlorothiazide 25 Mg Tabs (Hydrochlorothiazide) .... Take 1 Tab Daily 6)  Lisinopril 40 Mg Tabs (Lisinopril) .... Take One Tablet By Mouth Daily 7)  Carvedilol 12.5 Mg Tabs (Carvedilol) .... Take One and A Half Tablets By Mouth Twice A Day 8)  Lipitor 40 Mg Tabs (Atorvastatin Calcium) .... Take 1 Tablet By Mouth Once A Day 9)  Norvasc 5 Mg Tabs (Amlodipine Besylate) .... Take 1 Tablet By Mouth Once Daily  Allergies (verified): No Known Drug Allergies  Comments:  Nurse/Medical Assistant: patient brought meds states he takes all his meds as bottle states [facility] [place]  also reviewed from previous ov meds         Past History:  Past Medical History: Last updated: 07/30/2009 ASCVD-DES placed in the RCA in 10/2008;  residual 60% left anterior descending, 50% M1, 50% mid right;           EF-60% with inferior hypokinesisnegative stress nuclear study in 2006 and 05/2008 Cerebrovascular disease: Prior CVA; left carotid endarterectomy in 2004 HYPERTENSION (ICD-401.9) HYPERLIPIDEMIA (ICD-272.4) PVD (ICD-443.9)-ABIs in 12/2008 : Right--0.84; F.--0.82 Tobacco abuse-50 pack years discontinued in 1987 AODM-no insulin 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  Alcohol Use - no Regular Exercise - yes Drug Use - no  Review of Systems       see history of present illness,otherwise negative  Vital Signs:  Patient profile:   77 year old male Weight:      171 pounds BMI:     24.62 Pulse rate:   88 / minute BP sitting:   199 / 99  (left arm)  Vitals Entered By: Dreama Saa, CNA (May 14, 2010 10:32 AM)  Physical Exam  General:   Well-nournished, in no acute distress. Neck: No JVD, HJR, Bruit, or thyroid enlargement Lungs: No tachypnea, clear without wheezing, rales, or rhonchi Cardiovascular: RRR, PMI not displaced, heart sounds normal, positive S4 and a 1/6 systolic murmur at the left sternal border, no bruit, thrill, or heave. Abdomen: BS normal. Soft without organomegaly, masses,  lesions or tenderness. Extremities: without cyanosis, clubbing or edema. Good distal pulses bilateral SKin: Warm, no lesions or rashes  Musculoskeletal: No deformities Neuro: no focal signs    Impression & Recommendations:  Problem # 1:  HYPERTENSION (ICD-401.9) Patient's blood pressure remains elevated. I have increased his carvedilol to 18.75 mg b.i.d.. We will add Norvasc 5 mg daily. I discussed at 2 g sodium diet with the patient in detail.  He's also been given a-diet. He will 3 turn for a nurse visit in one week for blood pressure check and I will see him in 3 weeks. His updated medication list for this problem includes    Aspir-low 81 Mg Tbec (Aspirin) .Marland Kitchen... Take 1 tab daily    Hydrochlorothiazide 25 Mg Tabs (Hydrochlorothiazide) .Marland Kitchen... Take 1 tab daily    Lisinopril 40 Mg Tabs (Lisinopril) .Marland Kitchen... Take one tablet by mouth daily    Carvedilol 12.5 Mg Tabs (Carvedilol) .Marland Kitchen... Take one and a half tablets by mouth twice a day    Norvasc 5 Mg Tabs (Amlodipine besylate) .Marland Kitchen... Take 1 tablet by mouth once daily  Problem # 2:  ATHEROSCLEROTIC CARDIOVASCULAR DISEASE (ICD-429.2) Patient has history of coronary artery disease as listed in past medical history. Patient denies any recent symptoms.  Problem # 3:  HYPERLIPIDEMIA (ICD-272.4) Patient is currently taking the proper dose of Lipitor. Last office visit he was actually taking 2 different bottles resulting in 120 mg per day. Currently he is taking the correct dose. His updated medication list for this problem includes:    Lipitor 40 Mg Tabs (Atorvastatin calcium) .Marland Kitchen... Take 1 tablet by mouth once a day  Patient Instructions: 1)  Your physician recommends that you schedule a follow-up appointment in: 1 week for blood pressure check with nurse and in 3 weeks  2)  Your physician has recommended you make the following change in your medication: Increase Carvedilol to 18.75mg  (1 and 1/2 tablets) by mouth two times a day and start taking Norvasc 5mg  by mouth once daily  3)  Your physician has requested that you limit the intake of sodium (salt) in your diet to two grams daily. Please see [facility] handout. 4)  Please see DASH diet given to you at todays visit. Prescriptions: NORVASC 5 MG TABS (AMLODIPINE BESYLATE) take 1 tablet by mouth once daily  #30 x 3   Entered by:   Larita Fife Via LPN   Authorized by:   Marletta Lor, PA-C   Signed by:   Larita Fife Via LPN on 60/45/4098   Method used:    Electronically to        [facility] [phone]* (retail)       [address]       [place], [place]  [address]       Ph: [phone] or [phone]       Fax: [phone]   RxID:   [id number] CARVEDILOL 12.5 MG TABS (CARVEDILOL) Take one and a half tablets by mouth twice a day  #45 x 3   Entered by:   Larita Fife Via LPN   Authorized by:   Marletta Lor, PA-C   Signed by:   Larita Fife Via LPN on 56/38/7564   Method used:   Electronically to        [facility] [phone]* (retail)       [address]       [place], [place]  [address]  Ph: [phone] or [phone]       Fax: [phone]   RxID:   [id number]

## 2010-05-23 NOTE — Letter (Signed)
Summary: Handout Printed  Printed Handout:  - Diet - Sodium-Controlled 

## 2010-05-23 NOTE — Miscellaneous (Signed)
Summary: LABS LIPIDS,LIVER, 11/15/2009  Clinical Lists Changes  Observations: Added new observation of ALBUMIN: 4.1 g/dL (84/69/6295 28:41) Added new observation of PROTEIN, TOT: 6.4 g/dL (32/44/0102 72:53) Added new observation of SGPT (ALT): 17 units/L (11/15/2009 10:46) Added new observation of SGOT (AST): 19 units/L (11/15/2009 10:46) Added new observation of ALK PHOS: 35 units/L (11/15/2009 10:46) Added new observation of BILI DIRECT: 0.2 mg/dL (66/44/0347 42:59) Added new observation of LDL: 61 mg/dL (56/38/7564 33:29) Added new observation of HDL: 34 mg/dL (51/88/4166 06:30) Added new observation of TRIGLYC TOT: 82 mg/dL (16/04/930 35:57) Added new observation of CHOLESTEROL: 111 mg/dL (32/20/2542 70:62)

## 2010-06-05 ENCOUNTER — Ambulatory Visit: Payer: Self-pay | Admitting: Physician Assistant

## 2010-06-10 ENCOUNTER — Ambulatory Visit (INDEPENDENT_AMBULATORY_CARE_PROVIDER_SITE_OTHER): Payer: PRIVATE HEALTH INSURANCE | Admitting: Adult Health

## 2010-06-10 ENCOUNTER — Encounter: Payer: Self-pay | Admitting: Adult Health

## 2010-06-10 DIAGNOSIS — I10 Essential (primary) hypertension: Secondary | ICD-10-CM

## 2010-06-10 DIAGNOSIS — I2510 Atherosclerotic heart disease of native coronary artery without angina pectoris: Secondary | ICD-10-CM

## 2010-06-10 DIAGNOSIS — E785 Hyperlipidemia, unspecified: Secondary | ICD-10-CM

## 2010-06-18 ENCOUNTER — Ambulatory Visit: Payer: PRIVATE HEALTH INSURANCE

## 2010-06-18 ENCOUNTER — Encounter (INDEPENDENT_AMBULATORY_CARE_PROVIDER_SITE_OTHER): Payer: Self-pay | Admitting: *Deleted

## 2010-06-18 NOTE — Assessment & Plan Note (Signed)
Summary: 3 wk f/u per checkout on 05/14/10/tg/   Visit Type:  Follow-up Primary Provider:  Dr. Lubertha South   History of Present Illness: [name] is a 77 y/o AAM we are following with known history of hypertension, CAD with stent to the RCA in 2010, with residual disease elsewhere none greater than 60%, hyperlipidemia, PVD.  He was last seen by Mrs. Suezanne Cheshire PA for assessement of difficult to control hypertension . At that time she increase the dose of carvedilol to 18.75mg  two times a day and added norvasc 5mg  daily.  He is here for follow-up.  He unfortunately is not taking his medications as directed.  He is not taking Norvasc at all and is taking 18.75 mg 2 doses twice a day.  He continues to take lisinopril as well. He denies chest pain, shortness of breath, dizziness.  His BP remains elevated on this visit.  He states he does not eat salt anymore and basically eats a lot of boiled chicken.  Preventive Screening-Counseling & Management  Alcohol-Tobacco     Smoking Status: quit     Year Quit: 1987  Problems Prior to Update: 1)  Atherosclerotic Cardiovascular Disease  (ICD-429.2) 2)  Cerebrovascular Disease-left Cea  (ICD-437.9) 3)  Diabetes Mellitus, Type II  (ICD-250.00) 4)  Hypertension  (ICD-401.9) 5)  Hyperlipidemia  (ICD-272.4) 6)  Pvd  (ICD-443.9) 7)  Tobacco Abuse-quit  (ICD-305.1) 8)  Carcinoma, Larynx  (ICD-161.9)  Current Medications (verified): 1)  Plavix 75 Mg Tabs (Clopidogrel Bisulfate) .... Take 1 Tab Daily 2)  Aspir-Low 81 Mg Tbec (Aspirin) .... Take 1 Tab Daily 3)  Glucophage 500 Mg Tabs (Metformin Hcl) .... Take 2 Tabs Two Times A Day 4)  Glipizide 10 Mg Tabs (Glipizide) .... Take 1 Tab Two Times A Day 5)  Hydrochlorothiazide 25 Mg Tabs (Hydrochlorothiazide) .... Take 1 Tab Daily 6)  Lisinopril 40 Mg Tabs (Lisinopril) .... Take One Tablet By Mouth Daily 7)  Carvedilol 12.5 Mg Tabs (Carvedilol) .... Take 2 Tablets By Mouth Two Times A Day 8)  Lipitor 40 Mg  Tabs (Atorvastatin Calcium) .... Take 1 Tablet By Mouth Once A Day  Allergies (verified): No Known Drug Allergies  Comments:  Nurse/Medical Assistant: The patient's medications and allergies were reviewed with the patient and were updated in the Medication and Allergy Lists. PT HAS TWO BOTTLES OF CARVEDILOL 12.5MG  AND STATES HE IS TAKING BOTH. STATES PHARM. DID NOT HAVE NORVASC AND HAS NOT STARTED TAKING. 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   Vital Signs:  Patient profile:   77 year old male Height:      70 inches Weight:      171 pounds BMI:     24.62 O2 Sat:      98 % on Room air Pulse rate:   72 / minute BP sitting:   171 / 89  (left arm) Cuff  size:   regular  Vitals Entered By: Fuller Plan CMA (June 10, 2010 1:59 PM)  O2 Flow:  Room air  Physical Exam  General:  Well developed, well nourished, in no acute distress. Mouth:  poor dentition.   Lungs:  Clear bilaterally to auscultation and percussion. Heart:  Non-displaced PMI, chest non-tender; regular rate and rhythm, S1, S2 without murmurs, rubs or gallops. Carotid upstroke normal, no bruit. Normal abdominal aortic size, no bruits. Femorals normal pulses, no bruits. Pedals normal pulses. No edema, no varicosities. Abdomen:  Bowel sounds positive; abdomen  soft and non-tender without masses, organomegaly, or hernias noted. No hepatosplenomegaly. Pulses:  pulses normal in all 4 extremities Extremities:  No clubbing or cyanosis. Neurologic:  Alert and oriented x 3. Psych:  Normal affect.   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  I will increase his coreg to 25mg  two times a day.  He is to continue to take lisinopril and refills have been provided.  He will see [place] in the office in one week for a BP check.  May need to consider adding Norvasc once we have topped out on the current medications he is taking.  May also need to have family member come with him and assit in medication management with daily pill recepticle administration.  The following medications were removed from the medication list:    Norvasc 5 Mg Tabs (Amlodipine besylate) .Marland Kitchen... Take 1 tablet by mouth once daily His updated medication list for this problem includes:    Aspir-low 81 Mg Tbec (Aspirin) .Marland Kitchen... Take 1 tab daily    Hydrochlorothiazide 25 Mg Tabs (Hydrochlorothiazide) .Marland Kitchen... Take 1 tab daily    Lisinopril 40 Mg Tabs (Lisinopril) .Marland Kitchen... Take one tablet by mouth daily    Carvedilol 12.5 Mg Tabs (Carvedilol) .Marland Kitchen... Take 2 tablets by mouth two times a day  Problem # 2:  ATHEROSCLEROTIC CARDIOVASCULAR DISEASE (ICD-429.2) He is without complaint at this time.  With mutlitple CVRF, may need to consider re evaluation of CAD if BP is difficult to control.  Patient Instructions: 1)  Your physician recommends that you schedule a follow-up appointment in:  2 weeks 2)  You have been referred to nurse visit in 1 week Prescriptions: LISINOPRIL 40 MG TABS (LISINOPRIL) Take one tablet by mouth daily  #30 x 6   Entered by:   Teressa Lower RN   Authorized by:   Joni Reining, NP   Signed by:   Teressa Lower RN on 06/10/2010   Method used:   Electronically to          [facility] [phone]* (retail)       [address]       [place], [place]  [address]       Ph: [phone] or [phone]       Fax: [phone]   RxID:   [id number] CARVEDILOL 12.5 MG TABS (CARVEDILOL) take 2 tablets by mouth two times a day  #120 x 6   Entered by:   Teressa Lower RN   Authorized by:   Joni Reining, NP   Signed by:   Teressa Lower RN on 06/10/2010   Method used:   Electronically to        [facility] [phone]* (retail)       [address]  [place], [place]  [address]       Ph: [phone] or [phone]       Fax: [phone]   RxID:   [id number] HYDROCHLOROTHIAZIDE 25 MG TABS (HYDROCHLOROTHIAZIDE) take 1 tab daily  #30 x 6   Entered by:   Teressa Lower RN   Authorized by:   Joni Reining, NP   Signed by:   Teressa Lower RN on 06/10/2010   Method used:   Electronically to        [facility] [phone]* (retail)       [address]       [place], [place]  [address]       Ph: [phone] or [phone]       Fax: [phone]   RxID:   [id number] PLAVIX 75 MG TABS (CLOPIDOGREL BISULFATE) take 1 tab daily  #30 x 6   Entered by:   Teressa Lower RN   Authorized by:   Joni Reining, NP   Signed by:   Teressa Lower RN on 06/10/2010   Method used:   Electronically to        [facility] [phone]* (retail)       [address]       [place], [place]  [address]       Ph: [phone] or [phone]       Fax: [phone]   RxID:   [id number]

## 2010-06-27 NOTE — Letter (Signed)
Summary: Appointment - Missed  [facility]  [address] [place] [address]   Phone: [phone]  Fax: [phone]     June 18, 2010 MRN: [id number]   [place] [address] [place], [place]  [address]   Dear Mr. MATSUURA,  Our records indicate you missed your appointment on      06/18/10 NURSE VISIT                                    It is very important that we reach you to reschedule this appointment. We look forward to participating in your health care needs. Please contact us at the number listed above at your earliest convenience to reschedule this appointment.     Sincerely,    Glass blower/designer

## 2010-06-27 NOTE — Assessment & Plan Note (Signed)
Summary: 1 wk bp check per checkout on 05/14/10/tg  Nurse Visit   Vital Signs:  Patient profile:   77 year old male Height:      70 inches Weight:      167 pounds O2 Sat:      97 % on Room air Pulse rate:   77 / minute BP sitting:   180 / 86  (left arm)  Vitals Entered By: Teressa Lower RN (May 21, 2010 9:35 AM)  O2 Flow:  Room air   Visit Type:  1 week nurse visti Primary Provider:  Dr. Lubertha South   History of Present Illness: S: 1 week nurse visit B: office visit on 05/14/2010 A: pt started amlodipine 5mg  daily, but did not increase his carvedilol to to 12.5mg   1 1/2 tablets two times a day, he denies c/o R: changed directions on carvedilol bottle, pt is to be seen  06/05/2010 by you    Current Medications (verified): 1)  Plavix 75 Mg Tabs (Clopidogrel Bisulfate) .... Take 1 Tab Daily 2)  Aspir-Low 81 Mg Tbec (Aspirin) .... Take 1 Tab Daily 3)  Glucophage 500 Mg Tabs (Metformin Hcl) .... Take 2 Tabs Two Times A Day 4)  Glipizide 10 Mg Tabs (Glipizide) .... Take 1 Tab Two Times A Day 5)  Hydrochlorothiazide 25 Mg Tabs (Hydrochlorothiazide) .... Take 1 Tab Daily 6)  Lisinopril 40 Mg Tabs (Lisinopril) .... Take One Tablet By Mouth Daily 7)  Carvedilol 12.5 Mg Tabs (Carvedilol) .... Take One and A Half Tablets By Mouth Twice A Day 8)  Lipitor 40 Mg Tabs (Atorvastatin Calcium) .... Take 1 Tablet By Mouth Once A Day 9)  Norvasc 5 Mg Tabs (Amlodipine Besylate) .... Take 1 Tablet By Mouth Once Daily  Allergies (verified): No Known Drug Allergies Prescriptions: LISINOPRIL 40 MG TABS (LISINOPRIL) Take one tablet by mouth daily  #30 x 0   Entered by:   Larita Fife Via LPN   Authorized by:   Kathlen Brunswick, MD, [facility]   Signed by:   Larita Fife Via LPN on 04/54/0981   Method used:   Electronically to        [facility] [phone]* (retail)       [address]       [place], [place]  [address]       Ph: [phone] or [phone]       Fax: [phone]   RxID:    [id number] PLAVIX 75 MG TABS (CLOPIDOGREL BISULFATE) take 1 tab daily  #30 x 0   Entered by:   Larita Fife Via LPN   Authorized by:   Kathlen Brunswick, MD, [facility]   Signed by:   Larita Fife Via LPN on 63/87/5643   Method used:   Electronically to        [facility] [phone]* (retail)       [address]       [place], [place]  [address]       Ph: [phone] or [phone]       Fax: [phone]   RxID:   [id number]   Appended Document: 1 wk bp check per checkout on 05/14/10/tg Patient's carvedilol was increased at his last office visit and f/u made. No further recommendations.  Appended Document: 1 wk bp check per checkout on 05/14/10/tg pt made aware no changes at this time

## 2010-07-02 ENCOUNTER — Encounter: Payer: Self-pay | Admitting: Cardiology

## 2010-07-02 ENCOUNTER — Ambulatory Visit (INDEPENDENT_AMBULATORY_CARE_PROVIDER_SITE_OTHER): Payer: PRIVATE HEALTH INSURANCE | Admitting: Cardiology

## 2010-07-02 DIAGNOSIS — I10 Essential (primary) hypertension: Secondary | ICD-10-CM

## 2010-07-02 DIAGNOSIS — E782 Mixed hyperlipidemia: Secondary | ICD-10-CM

## 2010-07-18 NOTE — Assessment & Plan Note (Signed)
Summary: 3 wk f/u per checkout on 06/10/10/tg   Visit Type:  Follow-up Primary Provider:  Dr. Lubertha South   History of Present Illness: [name] returns to the office for continued assessment and treatment of hypertension.  His niece monitors his blood pressure on occasion with systolics typically in the 160s.  He denies chest discomfort or dyspnea.  Current Medications (verified): 1)  Plavix 75 Mg Tabs (Clopidogrel Bisulfate) .... Take 1 Tab Daily 2)  Aspir-Low 81 Mg Tbec (Aspirin) .... Take 1 Tab Daily 3)  Glucophage 500 Mg Tabs (Metformin Hcl) .... Take 2 Tabs Two Times A Day 4)  Glipizide 10 Mg Tabs (Glipizide) .... Take 1 Tab Two Times A Day 5)  Hydrochlorothiazide 25 Mg Tabs (Hydrochlorothiazide) .... Take 1 Tab Daily 6)  Lisinopril 40 Mg Tabs (Lisinopril) .... Take One Tablet By Mouth Daily 7)  Carvedilol 12.5 Mg Tabs (Carvedilol) .... Take 2 Tablets By Mouth Two Times A Day 8)  Lipitor 40 Mg Tabs (Atorvastatin Calcium) .... Take 1 Tablet By Mouth Once A Day 9)  Amlodipine Besylate 10 Mg Tabs (Amlodipine Besylate) .... Take One Tablet By Mouth Daily  Allergies (verified): No Known Drug Allergies  Comments:  Nurse/Medical Assistant: patient brought meds he uses [facility] in [place]  Past History:  PMH, FH, and Social History reviewed and updated.  Past Medical History: ASCVD-DES-> RCA 10/2008; residual 60% LAD+ 50% M1+ 50% midRCA;EF-60% with inf. HK:       neg. stress nuc. 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  Vital Signs:  Patient profile:   77 year old male Weight:      172 pounds BMI:     24.77 Pulse rate:   68 /  minute BP sitting:   138 / 77  (left arm)  Vitals Entered By: Dreama Saa, CNA (July 02, 2010 11:31 AM)  Physical Exam  General:  Well developed; no acute distress; proportionate weight and height HEENT-poor dentition   Neck-No JVD; no carotid bruits: Lungs-No tachypnea, no rales; no rhonchi; no wheezes: Cardiovascular-normal PMI; normal S1 and S2; prominent S4 present Abdomen-BS normal; soft and non-tender without masses or organomegaly:  Neurologic-Normal cranial nerves; symmetric strength and tone:  Skin-Warm, no significant lesions: Extremities- distal pulses intact; trace edema:   Impression & Recommendations:  Problem # 1:  ATHEROSCLEROTIC CARDIOVASCULAR DISEASE (ICD-429.2) No symptoms to suggest myocardial ischemia.  Current focus of therapy he is on optimal management of cardiovascular risk factors.  Problem # 2:  HYPERTENSION (ICD-401.9) Blood pressure appears finally to be controlled with the current value better than anything I seen within the past 2 years.  Patient will continue to have pressure monitored by his niece, will return in 3 months for reassessment by the cardiology nurses and in 7 months to see me.  Electrolytes and renal function will be monitored.  Problem # 3:  DIABETES MELLITUS, TYPE II (ICD-250.00) Assessment: Unchanged Most recent hemoglobin A1c level available to me was from 2 years ago and was quite elevated at 11.6.  Dr. Gerda Diss is managing this problem.  Problem # 4:  HYPERLIPIDEMIA (ICD-272.4) Most recent lipid profile was excellent; current therapy will be continued.  CHOL: 111 (11/15/2009)   LDL: 61 (11/15/2009)  HDL: 34 (11/15/2009)   TG: 82 (11/15/2009)  Patient Instructions: 1)  Your physician recommends that you schedule a follow-up appointment in: 7 MONTHS 2)  Your physician has recommended you make the following change in your medication: INCREASE AMLODIPINE 10MG  DAILY 3)  You have been referred to NURSE VISIT IN 3 MONTHS 4)  Your  physician has requested that you regularly monitor and record your blood pressure readings at home.  Please use the same machine at the same time of day to check your readings and record them to bring to your follow-up visit. PLEASE BRING BP READINGS TO NURSE VISIT IN 3 MONTHS Prescriptions: AMLODIPINE BESYLATE 10 MG TABS (AMLODIPINE BESYLATE) Take one tablet by mouth daily  #30 x 3   Entered by:   Teressa Lower RN   Authorized by:   Kathlen Brunswick, MD, [facility]   Signed by:   Teressa Lower RN on 07/02/2010   Method used:   Electronically to        [facility] [phone]* (retail)       [address]       [place], [place]  [address]       Ph: [phone] or [phone]       Fax: [phone]   RxID:   [id number]

## 2010-07-28 LAB — BASIC METABOLIC PANEL
BUN: 22 mg/dL (ref 6–23)
CO2: 32 mEq/L (ref 19–32)
Calcium: 10.4 mg/dL (ref 8.4–10.5)
Chloride: 103 mEq/L (ref 96–112)
Chloride: 105 mEq/L (ref 96–112)
Creatinine, Ser: 1.09 mg/dL (ref 0.4–1.5)
GFR calc Af Amer: >60 mL/min (ref >60)
GFR calc non Af Amer: >60 mL/min (ref >60)
GFR calc non Af Amer: >60 mL/min (ref >60)
Glucose, Bld: 102 mg/dL — ABNORMAL HIGH (ref 70–99)
Potassium: 4.2 mEq/L (ref 3.5–5.1)
Potassium: 3.4 mEq/L — ABNORMAL LOW (ref 3.5–5.1)
Sodium: 139 mEq/L (ref 135–145)
Sodium: 140 mEq/L (ref 135–145)

## 2010-07-28 LAB — DIFFERENTIAL
Basophils Absolute: 0 10*3/uL (ref 0.0–0.1)
Basophils Absolute: 0 10*3/uL (ref 0.0–0.1)
Basophils Relative: 0 % (ref 0–1)
Eosinophils Absolute: 0.2 10*3/uL (ref 0.0–0.7)
Eosinophils Relative: 2 % (ref 0–5)
Lymphocytes Relative: 20 % (ref 12–46)
Lymphocytes Relative: 28 % (ref 12–46)
Lymphs Abs: 1.6 10*3/uL (ref 0.7–4.0)
Lymphs Abs: 2 10*3/uL (ref 0.7–4.0)
Monocytes Absolute: 0.5 10*3/uL (ref 0.1–1.0)
Monocytes Absolute: 0.5 10*3/uL (ref 0.1–1.0)
Monocytes Relative: 6 % (ref 3–12)
Neutro Abs: 4.7 10*3/uL (ref 1.7–7.7)
Neutro Abs: 5.8 10*3/uL (ref 1.7–7.7)
Neutrophils Relative %: 72 % (ref 43–77)

## 2010-07-28 LAB — CBC
HCT: 38 % — ABNORMAL LOW (ref 39.0–52.0)
HCT: 43 % (ref 39.0–52.0)
HCT: 39.9 % (ref 39.0–52.0)
Hemoglobin: 13.4 g/dL (ref 13.0–17.0)
Hemoglobin: 14.8 g/dL (ref 13.0–17.0)
Hemoglobin: 13.5 g/dL (ref 13.0–17.0)
MCHC: 34.3 g/dL (ref 30.0–36.0)
MCV: 83.5 fL (ref 78.0–100.0)
Platelets: 172 10*3/uL (ref 150–400)
RBC: 5.14 MIL/uL (ref 4.22–5.81)
RDW: 13.6 % (ref 11.5–15.5)
RDW: 14 % (ref 11.5–15.5)
WBC: 7.4 10*3/uL (ref 4.0–10.5)
WBC: 8 10*3/uL (ref 4.0–10.5)
WBC: 7.8 10*3/uL (ref 4.0–10.5)

## 2010-07-28 LAB — CARDIAC PANEL(CRET KIN+CKTOT+MB+TROPI)
CK, MB: 1.9 ng/mL (ref 0.3–4.0)
CK, MB: 2.3 ng/mL (ref 0.3–4.0)
CK, MB: 2.6 ng/mL (ref 0.3–4.0)
Relative Index: INVALID (ref 0.0–2.5)
Relative Index: INVALID (ref 0.0–2.5)
Total CK: 69 U/L (ref 7–232)
Total CK: 79 U/L (ref 7–232)
Total CK: 92 U/L (ref 7–232)
Troponin I: 0.03 ng/mL (ref 0.00–0.06)
Troponin I: 0.04 ng/mL (ref 0.00–0.06)

## 2010-07-28 LAB — APTT
aPTT: 35 seconds (ref 24–37)
aPTT: 38 seconds — ABNORMAL HIGH (ref 24–37)

## 2010-07-28 LAB — GLUCOSE, CAPILLARY
Glucose-Capillary: 133 mg/dL — ABNORMAL HIGH (ref 70–99)
Glucose-Capillary: 151 mg/dL — ABNORMAL HIGH (ref 70–99)
Glucose-Capillary: 175 mg/dL — ABNORMAL HIGH (ref 70–99)
Glucose-Capillary: 116 mg/dL — ABNORMAL HIGH (ref 70–99)
Glucose-Capillary: 163 mg/dL — ABNORMAL HIGH (ref 70–99)

## 2010-07-28 LAB — PROTIME-INR
INR: 0.9 (ref 0.00–1.49)
INR: 1 (ref 0.00–1.49)
Prothrombin Time: 12.7 seconds (ref 11.6–15.2)

## 2010-08-06 LAB — DIFFERENTIAL
Basophils Absolute: 0 K/uL (ref 0.0–0.1)
Basophils Relative: 0 % (ref 0–1)
Eosinophils Absolute: 0.1 K/uL (ref 0.0–0.7)
Eosinophils Relative: 2 % (ref 0–5)
Lymphocytes Relative: 21 % (ref 12–46)
Lymphs Abs: 1.4 K/uL (ref 0.7–4.0)
Monocytes Absolute: 0.4 K/uL (ref 0.1–1.0)
Monocytes Relative: 7 % (ref 3–12)
Neutro Abs: 4.9 K/uL (ref 1.7–7.7)
Neutrophils Relative %: 71 % (ref 43–77)

## 2010-08-06 LAB — CBC
HCT: 39.1 % (ref 39.0–52.0)
Hemoglobin: 13.6 g/dL (ref 13.0–17.0)
MCHC: 34.6 g/dL (ref 30.0–36.0)
MCV: 83.2 fL (ref 78.0–100.0)
Platelets: 169 K/uL (ref 150–400)
RBC: 4.7 MIL/uL (ref 4.22–5.81)
RDW: 13.5 % (ref 11.5–15.5)
WBC: 6.8 K/uL (ref 4.0–10.5)

## 2010-08-06 LAB — BASIC METABOLIC PANEL
Chloride: 102 mEq/L (ref 96–112)
GFR calc Af Amer: >60 mL/min (ref >60)
Potassium: 4.6 mEq/L (ref 3.5–5.1)
Sodium: 137 mEq/L (ref 135–145)

## 2010-08-06 LAB — GLUCOSE, CAPILLARY
Glucose-Capillary: 105 mg/dL — ABNORMAL HIGH (ref 70–99)
Glucose-Capillary: 116 mg/dL — ABNORMAL HIGH (ref 70–99)
Glucose-Capillary: 147 mg/dL — ABNORMAL HIGH (ref 70–99)
Glucose-Capillary: 169 mg/dL — ABNORMAL HIGH (ref 70–99)
Glucose-Capillary: 72 mg/dL (ref 70–99)
Glucose-Capillary: 79 mg/dL (ref 70–99)

## 2010-08-06 LAB — HEPATIC FUNCTION PANEL
ALT: 11 U/L (ref 0–53)
AST: 14 U/L (ref 0–37)
Albumin: 3.7 g/dL (ref 3.5–5.2)
Alkaline Phosphatase: 43 U/L (ref 39–117)
Bilirubin, Direct: 0.1 mg/dL (ref 0.0–0.3)
Indirect Bilirubin: 0.7 mg/dL (ref 0.3–0.9)
Total Bilirubin: 0.8 mg/dL (ref 0.3–1.2)
Total Protein: 6.1 g/dL (ref 6.0–8.3)

## 2010-08-06 LAB — LIPID PANEL
Cholesterol: 143 mg/dL (ref 0–200)
LDL Cholesterol: 91 mg/dL (ref 0–99)
Total CHOL/HDL Ratio: 4.8 RATIO

## 2010-08-06 LAB — CARDIAC PANEL(CRET KIN+CKTOT+MB+TROPI)
CK, MB: 1.4 ng/mL (ref 0.3–4.0)
CK, MB: 1.8 ng/mL (ref 0.3–4.0)
Relative Index: INVALID (ref 0.0–2.5)
Relative Index: INVALID (ref 0.0–2.5)
Total CK: 51 U/L (ref 7–232)
Total CK: 53 U/L (ref 7–232)
Troponin I: <0.01 ng/mL (ref 0.00–0.06)

## 2010-08-06 LAB — HEMOGLOBIN A1C: Hgb A1c MFr Bld: 11.6 % — ABNORMAL HIGH (ref 4.6–6.1)

## 2010-09-03 NOTE — Cardiovascular Report (Signed)
[name], [name]          ACCOUNT NO.:  [id number]   MEDICAL RECORD NO.:  [id number]          PATIENT TYPE:  INP   LOCATION:  2503                         FACILITY:  [facility]   PHYSICIAN:  Bruce R. Juanda Chance, MD, FACCDATE OF BIRTH:  Feb 20, 1934   DATE OF PROCEDURE:  11/01/2008  DATE OF DISCHARGE:                            CARDIAC CATHETERIZATION   CLINICAL HISTORY:  [name] is a 77 year old and was recently  admitted to [facility] with chest pain.  He has diabetes,  hyperlipidemia, and hypertension and he has had a previous carotid  endarterectomy.  He was seen in consultation by Dr. Dietrich Pates who  recommended evaluation with catheterization and was transferred here for  the procedure.   PROCEDURE:  The procedure was performed by the right femoral artery and  arterial sheath and 5-French pyriform coronary catheters.  A front wall  arterial puncture was performed and Omnipaque contrast was used.  After  completion of the diagnostic study, we made a decision to proceed with  intervention on the lesion in the distal right coronary artery.   The patient was given Angiomax bolus and infusion.  He had previously  been given four chewable aspirin.  He was given 600 mg of Plavix and 20  mg of Pepcid.  The vessel was quite tortuous with some calcification, so  we anticipated some difficulty getting stents around the bend.  For this  reason, we used an AL-1 guiding catheter with side holes.  We used a PT2  Light Support wire and advanced this down.  The right coronary artery  and across the lesion without too much difficulty.  We predilated with a  2.0 x 15-mm apex balloon performing one inflation up to 7 atmospheres  for 30 seconds.  We then deployed a 2.5 x 15-mm XIENCE stent with the  proximal edge of the stent just about the level of the posterior  descending branch and the stent itself extending into the AV portion of  the right coronary artery filling a moderately large  posterolateral  branch.  We deployed this with one inflation of 8 atmospheres for 30  seconds.  We then postdilated the midportion of the stent with a 2.5 x  12-mm [place] apex performing one inflation up to 18 inches for 30 seconds.  Final diagnostics were then performed through the guiding catheter.  The  patient tolerated the procedure well and left the laboratory in  satisfactory condition.   The right femoral artery was not closed because of the lesion located  just above the entry site through which the sheath had passed.   RESULTS:  Left main coronary artery:  The left main coronary artery was  free of significant disease.   Left anterior descending artery:  The left anterior descending artery  had moderate calcification gave rise to the diagonal branch and septal  perforators and then small septal perforators.  There was a long area of  50-70% narrowing in the proximal LAD.   Circumflex artery:  The circumflex artery gave rise to a ramus branch, a  large marginal branch, and two posterolateral branches.  There were  irregularities  in these vessels.  There was 50% narrowing in the  midportion of the first posterolateral branch.   Right coronary artery:  The right coronary artery was a small-to-  moderate-sized vessel gave rise to a conus branch, a right ventricular  branch, a posterior descending branch, and a posterolateral branch.  The  vessel was quite tortuous and irregular with some calcification.  There  was 50% narrowing in the midvessel.  There was a 95% stenosis in the  distal vessel just after the posterior descending branch.   Left ventriculogram:  The left ventriculogram was performed in the RAO  projection showed slight hypokinesis in the inferobasal wall.  The  overall wall motion was good.  The estimated fraction of 60%.   Following stenting of the lesion in the distal right carotid, the  stenosis improved from 95% to 0%.   CONCLUSION:  1. Coronary artery  disease with 50-70% narrowing in the proximal LAD,      50% narrowing in the first posterolateral branch of the circumflex      artery, 50% narrowing in the mid-right coronary artery and 95%      stenosis in the distal right coronary artery with slight      inferobasal wall hypokinesis and an estimated ejection fraction of      60%.  2. Successful PCI of the lesion in the distal right coronary artery      using a XIENCE drug-eluting stent with improvement in the central      narrowing from 95% to 0%.   DISPOSITION:  The patient returned to the [facility] room for further  observation.  If he is stable, we can plan discharge tomorrow.  He  should remain on Plavix for at least a year.      Bruce Elvera Lennox Juanda Chance, MD, [facility]  Electronically Signed     BRB/MEDQ  D:  11/01/2008  T:  11/02/2008  Job:  [id number]   cc:   Lorin Picket A. Gerda Diss, MD  Gerrit Friends. Dietrich Pates, MD, [facility]

## 2010-09-03 NOTE — Group Therapy Note (Signed)
[name], [name]          ACCOUNT NO.:  [id number]   MEDICAL RECORD NO.:  [id number]          PATIENT TYPE:  INP   LOCATION:  A301                          FACILITY:  [facility]   PHYSICIAN:  Scott A. Gerda Diss, MD    DATE OF BIRTH:  05-28-1933   DATE OF PROCEDURE:  DATE OF DISCHARGE:                                 PROGRESS NOTE   The patient was admitted in yesterday with chest discomfort and poorly  controlled diabetes.  He is having a stress Myoview today.  He feels  good this morning.  Denies any chest pain.  Denies nausea, vomiting.  He  is learning how to do insulin injections.  His lungs are clear.  His  heart is regular and pulse is normal and BP good.  Cardiac enzymes  negative.  Await stress Myoview today and do diabetic teaching as well.  Hopefully home in the morning and to follow up after a stress test.      Scott A. Gerda Diss, MD  Electronically Signed     SAL/MEDQ  D:  06/14/2008  T:  06/14/2008  Job:  [id number]

## 2010-09-03 NOTE — Consult Note (Signed)
[name], [name]          ACCOUNT NO.:  [id number]   MEDICAL RECORD NO.:  [id number]          PATIENT TYPE:  INP   LOCATION:  A301                          FACILITY:  [facility]   PHYSICIAN:  Gerrit Friends. Dietrich Pates, MD, FACCDATE OF BIRTH:  01-Jul-1933   DATE OF CONSULTATION:  06/13/2008  DATE OF DISCHARGE:                                 CONSULTATION   CARDIOLOGIST:  He was previously seen by Dr. Vida Roller as well as  Dr. Charlton Haws.  He will now be established with Dr. Dietrich Pates   REASON FOR CONSULTATION:  Chest pain.   HISTORY OF PRESENT ILLNESS:  Mr. Champine is a 77 year old male patient  with history of multiple cardiac risk factors but no documented CAD who  presents with complaints of chest discomfort for the last 2 weeks.  He  notes left-sided sharp chest pain that occurs at any time.  He denies  any exertional symptoms.  The chest pain lasts about 1-2 minutes with  resolution on its own.  He denies any associated shortness of breath,  nausea or diaphoresis.  He denies syncope.  He denies orthopnea or PND.  He went to see his primary care physician today for follow-up on  diabetes, as well as other issues, and noted chest pain to him.  It was  recommended that he be admitted to [facility] for further  evaluation.   PAST MEDICAL HISTORY:  1. The patient has a history of chest discomfort that required      admission to [facility] in 2006.  The Myoview scan that      was done at that time was reportedly normal.  He had another      Myoview in 2004, secondary to abnormal EKG.  This demonstrated a      small persistent inferior defect.  He has had good LV function in      the past by nuclear study.  2. Cerebrovascular disease, status post left carotid endarterectomy      with prior history of TIA.  3. Hypertension.  4. Hyperlipidemia.  5. Diabetes mellitus.  6. Peripheral arterial disease.  7. Laryngeal cancer, status post radiation therapy.  8.  Status post appendectomy.   MEDICATIONS AT HOME:  1. Lotrel 05/20 mg daily.  2. HCTZ 25 mg daily.  3. Glucotrol XL 10 mg b.i.d.  4. Lipitor 10 mg daily.  5. Glucophage 500 mg 2 tablets b.i.d.   ALLERGIES:  NO KNOWN DRUG ALLERGIES.   SOCIAL HISTORY:  The patient lives in [place] by himself.  He is a  widower.  He quit smoking many years ago after a 20 pack-year history.  He quit drinking alcohol about 20 years ago.  He is unemployed.  Previously he was employed as a Copy for the school system.   FAMILY HISTORY:  His father died of a myocardial infarction in his 73s.   REVIEW OF SYSTEMS:  Please see HPI.  Denies fever, chills, headache,  rash, dysuria, hematuria, bright red blood per rectum or melena,  dysphagia, orthopnea, PND, pedal edema, syncope, near-syncope,  claudication or cough.  The patient does  note some left dorsal foot pain  that is worse at night.  Rest of the review of systems negative.   PHYSICAL EXAM:  He is a well-nourished male.  VITAL SIGNS:  His blood pressure is 124/74, pulse 70, respirations 18,  temperature 98.6.  HEENT is normal.  NECK:  Without JVD.  LYMPH:  Without lymphadenopathy.  ENDOCRINE:  Without thyromegaly.  CARDIAC:  Normal S1, S2, regular rate and rhythm without murmur.  LUNGS:  With decreased breath sounds bilaterally.  No rales, no  wheezing.  SKIN:  Without rash.  ABDOMEN:  Soft, nontender with normoactive bowel sounds.  No  organomegaly.  EXTREMITIES:  Without clubbing, cyanosis or edema.  MUSCULOSKELETAL:  Without joint deformity.  NEUROLOGIC:  He is alert and oriented x3.  Cranial nerves II-XII grossly  intact.  VASCULAR :  No carotid bruits noted bilaterally.  Positive femoral  artery bruits are noted bilaterally with 1 to 2+ popliteal pulses  bilaterally.  Dorsalis pedis and posterior tibialis pulses are difficult  to palpate probably 1 to 2+.   CHEST X-RAY: no acute cardiac or pulmonary process.   EKG:  Sinus rhythm at  72, inferior Q-waves, no ischemic changes, left  axis deviation.   LABS:  White count 6800, hemoglobin 13.6, platelet count 169,000,  potassium 4.6, creatinine 1.2, glucose 211.  Cardiac enzymes negative  times one.   ASSESSMENT:  1. Atypical chest discomfort in a 77 year old male with history of      diabetes mellitus, hypertension and hyperlipidemia with prior      nonischemic nuclear studies (the last of which was in 2006) and      reported normal LV function in the past.  2. Cerebrovascular disease, status post left carotid endarterectomy.  3. Peripheral arterial disease by exam with recent history of foot      pain.   RECOMMENDATIONS:  The patient presents with atypical chest discomfort.  Serial cardiac markers will be checked, as well as EKGs.  The patient  will be set up for stress Myoview study in the morning as long as he is  ruled out for myocardial infarction.  Given his PAD on exam, ABIs plus  arterial Dopplers will be obtained.   Thank you very much for the consultation.  We will be glad to follow the  patient throughout the remainder of this admission.      Tereso Newcomer, PA-C      Gerrit Friends. Dietrich Pates, MD, [facility]  Electronically Signed    SW/MEDQ  D:  06/14/2008  T:  06/14/2008  Job:  [id number]

## 2010-09-03 NOTE — Assessment & Plan Note (Signed)
[facility]                       [facility] OFFICE NOTE   NAME:[name], [name]                   MRN:          [id number]  DATE:12/08/2006                            DOB:          [birth year]    [name] is a new patient to me.  He has previously been  seen by Dr. Dorethea Clan about two years ago.  The patient has a history of  vascular disease with previous left carotid endarterectomy.  He has had  atypical chest pain in the past.  He has had normal Myoviews in December  of 2004 and January of 2006.  He is not having any recurrent chest  pains.  His coronary risk factors include hypertension, diabetes,  hyperlipidemia and known peripheral vascular disease.  He is a  nonsmoker.   The patient has been fairly active.  He is not having any significant  claudication.  There has been no chest pain, PND or orthopnea.  He has  not had his carotids checked in a while.  There has been no TIA or CVA.  He has been compliant with his medication and, according to him, Dr.  Gerda Diss thinks his cholesterol and blood pressure have been running well.   I do not have recent lipids on him or a hemoglobin A1c.   CURRENT MEDICATIONS INCLUDE:  1. Glucotrol 10 b.i.d.  2. Lipitor 10 a day.  3. Hydrochlorothiazide 25 a day.  4. Atenolol 50 a day.  5. Lotrel 5/40.   EXAM TODAY:  Is remarkable for an elderly-appearing black male in no  distress.  Affect is appropriate.  He has poor dentition.  Weight is 173, blood pressure is 130/80, pulse 56 and regular,  respiratory rate is 14, he is afebrile.  HEENT:  Normal.  He is status post left CEA with a slight residual  bruit.  The right side is clear.  There is no thyromegaly, no  lymphadenopathy and no JVP elevation.  LUNGS:  Clear, good diaphragmatic motion, no wheezing.  There is an S1, S2, normal heart sounds.  PMI is normal.  ABDOMEN:  Protuberant.  Bowel sounds are positive.  There is no  tenderness, no AAA, no hepatosplenomegaly, no hepatojugular reflux, no  bruits.  Femorals are +3 bilaterally.  There is trace lower extremity edema.  PTs  are +2.  NEUROLOGIC:  Nonfocal.  There is no muscular weakness.  SKIN:  Warm and dry.   IMPRESSION:  1. History of atypical chest pain, not recurrent.  Normal Myoviews in      2004 and 2006, despite multiple risk factors, including diabetes.      I think we could wait until 2009 or 2010 to follow up any stress      testing.  2. Previous carotid endarterectomy with residual bruit.  He needs      followup duplex.  He has not had one in quite some time.  So long      as he does not have a high-grade stenosis, this can be followed      every two years.  Continue aspirin therapy.  3. Hyperlipidemia.  Follow up with Dr.  Luking.  Continue Lipitor.  He      is on a relatively low dose.  Follow up lipid and liver profile in      six months.  4. Hypertension, currently well-controlled.  Continue atenolol, Lotrel      and low-dose diuretic.  5. Mild lower extremity edema.  Continue low-salt diet.  On      hydrochlorothiazide 25 a day.  Elevate legs at the end of the day.  6. Diabetes.  Continue Glucotrol.  Hemoglobin A1c per Dr. Gerda Diss.      Watch caloric intake.   As long as the patient's carotid duplex does not show residual stenosis,  I will see him back in a year.     Noralyn Pick. Eden Emms, MD, [facility]  Electronically Signed    PCN/MedQ  DD: 12/08/2006  DT: 12/09/2006  Job #: [id number]   cc:   [name]. [name], M.D.

## 2010-09-03 NOTE — Discharge Summary (Signed)
[name], [name]          ACCOUNT NO.:  [id number]   MEDICAL RECORD NO.:  [id number]          PATIENT TYPE:  INP   LOCATION:  2503                         FACILITY:  [facility]   PHYSICIAN:  Bruce R. Juanda Chance, MD, FACCDATE OF BIRTH:  [birth year]   DATE OF ADMISSION:  11/01/2008  DATE OF DISCHARGE:  11/02/2008                               DISCHARGE SUMMARY   DISCHARGE DIAGNOSES:  1. Coronary artery disease, status post successful percutaneous      coronary intervention.  2. Distal right coronary artery with Xience drug-eluting stent.   SECONDARY DIAGNOSES:  1. Hypertension.  2. Diabetes.  3. Hyperlipidemia.  4. Peripheral vascular disease.  5. History of cardiovascular accident.  6. Status post left endarterectomy, 2004.  7. History of throat cancer, 1987.   ALLERGIES:  NKDA.   PROCEDURES:  1. EKG completed October 31, 2008, showing normal sinus rhythm, no acute      ST-segment changes, slight left axis deviation.  2. Chest x-ray completed November 01, 2008, showing no active      cardiopulmonary disease.  3. Cardiac catheterization completed on November 01, 2008, showing;  3A.  Coronary artery disease with 50 - 70% narrowing of the proximal  left anterior descending, 50% narrowing in the first posterior lateral  branch of the circumflex artery, 50% narrowing in the mid-right coronary  artery, and 95% stenosis in the distal right coronary artery with slight  inferior basal wall hypokinesis and an estimated ejection fraction of  60%.  3B.  Successful percutaneous coronary intervention of lesion in the  distal cath right coronary artery using Xience drug-eluting stent with  improvement in central narrowing from 95% to 0%.  1. EKG showing no significant changes from prior tracing.  2. EKG showing normal sinus rhythm with premature atrial complexes at      a rate of 80, otherwise no significant changes from prior tracing.   HISTORY OF PRESENT ILLNESS:  Mr. Maffett is a 77 year old  African  American male with multiple previous presentations of chest pain, now  presenting with recurrent symptoms.  The patient has significant  cardiovascular risk factors including diabetes, treated with p.o. meds;  hypertension; and hyperlipidemia.  No known history of CAD and has been  evaluated with stress nuclear studies in 2004, 2006, and earlier this  year, all with negative results.  The patient reports with 3 days of  intermittent left chest discomfort, moderate severity, no associated  symptoms, and no radiation.  No relationship to exertion.  Onset and  cessation of symptoms, unpredictable.  Episodes last approximately 20  minutes or less.  Has a nitroglycerin home, but do not use it.  Initial  testing has been negative with normal cardiac markers and no acute  changes on EKG.   HOSPITAL COURSE:  The patient admitted and underwent procedures as  described above.  He tolerated them well without significant  complications.  The patient was seen and evaluated by Dr. Juanda Chance on the  morning of November 02, 2008, and deemed stable for discharge.  The patient  ambulated without chest pain or shortness of breath.  Vital signs  remained stable  over the last 24 hours.  The groin was okay.  The  patient will follow up in approximately 2 weeks with primary  cardiologist, Dr. Dietrich Pates.  At the time of discharge, the patient  received extensive education/instructions on his new medication list,  which includes prescriptions for full-dose aspirin and Plavix, as well  as follow up instructions of postcath instructions.  At the time of  discharge, all questions and concerns were addressed.   DISCHARGE LABORATORIES:  WBC 7.8, HGB 13.5, HCT 39.9, and PLT count 154.  Sodium 140, potassium 3.4, chloride 105, CO2 of 29, BUN 15, creatinine  1.17, glucose is 57, blood glucose while inpatient ranged from 56-175,  and calcium was 9.1.   FOLLOWUP PLANS AND APPOINTMENTS:  Dr. Dietrich Pates in approximately 2  weeks.   DISCHARGE MEDICATIONS:  1. Lantus 22 units subcu injection nightly.  2. Nortriptyline 50 mg p.o. at bedtime.  3. Actos 15 mg p.o. daily.  4. Enteric-coated aspirin 325 mg p.o. daily.  5. Hydrochlorothiazide 25 mg p.o. daily.  6. Lotrel 5/20 mg p.o. daily.  7. Lipitor 10 mg p.o. daily.  8. Glipizide XL 10 mg p.o. b.i.d.  9. Metformin 500 mg p.o. b.i.d. to be restarted on November 03, 2008.  10.Plavix 75 mg p.o. daily.  11.Nitroglycerin 0.4 mg sublingual p.r.n. for chest pain.   Duration of discharge encounter including physician time was 35 minutes.      Jarrett Ables, PAC      Bruce R. Juanda Chance, MD, [facility]  Electronically Signed    MS/MEDQ  D:  11/02/2008  T:  11/02/2008  Job:  [id number]   cc:   Gerrit Friends. Dietrich Pates, MD, [facility]  Dr. Gerda Diss

## 2010-09-03 NOTE — H&P (Signed)
NAMEJALONI, [name]          ACCOUNT NO.:  [id number]   MEDICAL RECORD NO.:  [id number]          PATIENT TYPE:  INP   LOCATION:  A304                          FACILITY:  [facility]   PHYSICIAN:  Scott A. Gerda Diss, MD    DATE OF BIRTH:  1933-11-09   DATE OF ADMISSION:  10/31/2008  DATE OF DISCHARGE:  LH                              HISTORY & PHYSICAL   CHIEF COMPLAINT:  Chest pain.   HISTORY OF PRESENT ILLNESS:  This gentleman presents to the office.  He  does have a difficult time relaying the details of what is going on with  him, but he states he has been having some chest pain.  When asked  specifically to indicate what is going on with this, he relates it more  as an ache.  It lasts anywhere from a few minutes to 20-30 minutes at  times, has been present for the past few days, was not occurring before  this.  He has had chest pain in the past with negative Myoviews as  recent as February 2010.  He certainly has a lot of risk factors for  heart disease including hypertension, diabetes, hyperlipidemia and a  previous peripheral vascular disease with CVA and left endarterectomy in  2004.  He denies any discomfort with exertion.  He denies sweats,  chills.  Denies cough, fever. Denies nausea, vomiting, diarrhea.   PAST MEDICAL HISTORY:  1. Throat cancer in 1987.  2. Left endarterectomy in 2004.   FAMILY HISTORY:  Pertinent for diabetes, MI and hypertension.   SOCIAL HISTORY:  Does not smoke or drink.   ALLERGIES:  None.   MEDICATIONS:  He relates compliance.  1. Lotrel 5/21 daily.  2. HCTZ 25 mg daily.  3. Glucotrol XL 10 mg b.i.d.  4. Lipitor 10 mg daily.  5. Baby aspirin 81 mg daily.  6. Actos 15 mg daily.  7. Nortriptyline 25 mg two nightly.  8. Glucophage 500 mg 2 p.o. b.i.d.  9. Lantus 22 units nightly.   REVIEW OF SYSTEMS:  See per above.  Negative for fever, cough, shortness  of breath, swelling in the legs, PND.  Positive for chest discomfort.   PHYSICAL  EXAMINATION:  __________  BP elevated 170/90, but on recheck 148/78, weight 171.  TMs and LT - NL.  NECK:  No masses.  CHEST:  CTA.  No crackles.  HEART:  No murmurs heard, regular rhythm with an occasional what sounds  like a PAC.  ABDOMEN:  Soft.  SKIN:  Warm, dry.  NEUROLOGIC:  Grossly normal.   EKG:  Little bit of left axis deviation.  Normal sinus rhythm, no acute  ST-segment changes.   ASSESSMENT/PLAN:  Chest pain.  Because of his risk factors and the fact  that we just cannot safely rule out the possibility of heart disease, I  really think this patient needs further evaluation.  Also because of  numerous workups in the past which have been negative, the question I  guess comes to mind, does he need to have a catheterization versus  another Myoview.  We will go ahead and consult Cardiology, have  them see  him and pursue it from there.      Scott A. Gerda Diss, MD  Electronically Signed     SAL/MEDQ  D:  10/31/2008  T:  10/31/2008  Job:  [id number]

## 2010-09-03 NOTE — Consult Note (Signed)
[name], [name]          ACCOUNT NO.:  [id number]   MEDICAL RECORD NO.:  [id number]          PATIENT TYPE:  INP   LOCATION:  A304                          FACILITY:  [facility]   PHYSICIAN:  Gerrit Friends. Dietrich Pates, MD, FACCDATE OF BIRTH:  1933/12/20   DATE OF CONSULTATION:  DATE OF DISCHARGE:                                 CONSULTATION   REFERRING PHYSICIAN:  Dr. Lilyan Punt.   PRIMARY CARDIOLOGIST:  Dr. Dietrich Pates.   HISTORY OF PRESENT ILLNESS:  A 77 year old gentleman with multiple  previous presentations with chest pain, now experiencing a recurrence.  [name] has significant cardiovascular risk factors including  diabetes treated with an oral agent, hypertension, and hyperlipidemia.  He has no documented coronary artery disease and has been evaluated with  stress nuclear studies in 2004, 2006, and earlier this year, all of  which have been negative.  For the last 3 days, he has experienced  intermittent left chest discomfort of moderate severity with no  associated symptoms and no radiation.  There is no relationship to  exertion.  Onset and offset are unpredictable.  Episodes last 20 minutes  or less.  He had nitroglycerin at home, but did not use it.  His initial  testing has been negative with normal cardiac markers and no acute  changes on his EKG.   PAST MEDICAL HISTORY:  Notable for cerebrovascular disease.  He  underwent endarterectomy in 2004.  Ultrasound studies have been good  since then, most recently in 2008.  He also has a history of peripheral  artery disease and laryngeal cancer treated many years ago with  radiation therapy.  He underwent appendectomy in 2003.   MEDICATIONS:  1. Lotrel 5/20 mg daily.  2. HCTZ 25 mg daily.  3. Glucotrol XL 10 mg b.i.d.  4. Atorvastatin 10 mg daily.  5. Aspirin 81 mg daily.  6. Actos 15 mg daily.  7. Nortriptyline 50 mg nightly.  8. Metformin 1000 mg b.i.d.  9. Lantus insulin 22 units q.p.m.   He has no known  medical allergies.   SOCIAL HISTORY:  Lives locally and lives alone due to the fact that he  is a widower.  A 20-pack-year history of cigarette smoking that was  discontinued some years ago.  He also consumed significant amount of  alcohol when he was younger, but has not done so for at least two  decades.  He is retired.  Previously employed as a Copy for the  school system.   FAMILY HISTORY:  Father died due to myocardial infarction in his 22s.   REVIEW OF SYSTEMS:  Notable for mild arthritic discomfort.  He has  bilateral hearing impairment as well as suboptimal dentition.  He has  insomnia with middle of the night awakening.  All other systems reviewed  and are negative.   PHYSICAL EXAMINATION:  GENERAL:  On exam, pleasant well-appearing  gentleman in no acute distress.  VITAL SIGNS:  The temperature is 97, heart rate 75, respirations 18,  blood pressure 145/80, O2 saturation 97% on room air.  Weight 77.6 kg,  height 70 inches.  HEENT:  Anicteric sclerae; EOMs  full; normal oral mucosa.  NECK:  No jugular venous distention; no carotid bruits.  ENDOCRINE:  No thyromegaly.  HEMATOPOIETIC:  No adenopathy.  SKIN:  No significant lesions.  LUNGS:  Clear.  CARDIAC:  Normal first and second heart sounds; normal PMI.  ABDOMEN:  Soft and nontender; no masses; no organomegaly; no bruits;  aortic pulsation not palpable.  EXTREMITIES:  Distal pulses intact; trace ankle edema.  NEUROLOGIC:  Symmetric strength and tone; normal cranial nerves.  PSYCHIATRIC:  Alert and oriented; normal affect.   Laboratory notable for normal CBC, normal prothrombin time, glucose of  151, normal chemistry profile and normal cardiac markers.  EKG:  Normal sinus rhythm; left anterior fascicular block; left atrial  enlargement; right ventricular conduction delay; otherwise unremarkable.  No prior tracing for comparison.   IMPRESSION:  [name] presents with recurrent chest discomfort.  He  experiences  significant episodes every few years.  Yet another negative  stress test would not provide [place] with any reassurance.  Unfortunately,  this nice gentleman has very significant risk factors for coronary  disease including a history of cerebrovascular disease, and it will be  necessary to admit him for observation whenever he presents with  symptoms that could reflect coronary artery disease.  Accordingly, I  believe it is appropriate to proceed with cardiac catheterization for  definitive diagnosis.  The risks and benefits have been described to Mr.  [name], who agrees to proceed.  We will arrange for that test in the  a.m., hopefully to be performed tomorrow afternoon.  I greatly  appreciate the request for consultation and hope that [name]'  evaluation will be negative for significant coronary disease.      Gerrit Friends. Dietrich Pates, MD, [facility]  Electronically Signed     RMR/MEDQ  D:  10/31/2008  T:  11/01/2008  Job:  [id number]

## 2010-09-03 NOTE — H&P (Signed)
[name], [name]          ACCOUNT NO.:  [id number]   MEDICAL RECORD NO.:  [id number]          PATIENT TYPE:  INP   LOCATION:  A301                          FACILITY:  [facility]   PHYSICIAN:  Donna Bernard, M.D.DATE OF BIRTH:  05-16-1933   DATE OF ADMISSION:  06/13/2008  DATE OF DISCHARGE:  LH                              HISTORY & PHYSICAL   CHIEF COMPLAINT:  Chest pain.   SUBJECTIVE:  This patient is a 77 year old black gentleman with a  history of hypertension, prior stroke, type 2 diabetes mellitus,  hyperlipidemia, remote throat cancer, and recurrent noncompliance who  presented to the office today with multiple concerns.  The patient noted  that he has been experiencing chest discomfort.  It is intermittent,  left-sided in nature, at times achy, at times with pressure.  It can  occur at rest, it can occur with exertion.  There is no associated  nausea or diaphoresis.  The patient thinks he might get slightly short  of breath at times.  Of note, we have not seen this patient for over a  year.  This is due to multiple no shows.  The patient claims he has  compliance with medication, but in the past has admitted to the gone  periods of times without taking any of his medicines.  Currently, has  not been checking his sugars either, although he states he did check one  today and it was in the low 200s.   PAST SURGICAL HISTORY:  Remote surgery of throat cancer surgery 1987,  left endarterectomy 2004.   PAST MEDICAL HISTORY:  Significant for chronic problems as noted above.   FAMILY HISTORY:  Significant for diabetes, coronary artery disease.   ALLERGIES:  No true allergies to meds but sensitive to FLU SHOTS.   SOCIAL:  The patient is widowed.  No tobacco use.  No alcohol use.  Two  children.   MEDICATIONS:  The patient claims compliance with meds, which include,  1. Lotrel 5/20 one q.a.m.  2. Hydrochlorothiazide 25 q.a.m.  3. Glucotrol XL 10 mg one p.o. b.i.d.  4.  Lipitor 10 mg p.o. at bedtime.  5. Coated aspirin 81 mg q.a.m.  6. The patient was on Actos, but he thinks he has stopped it.  7. Nortriptyline 25 mg 2 at bedtime.  8. Glucophage 500 mg 2 p.o. b.i.d.   PHYSICAL EXAMINATION:  VITAL SIGNS:  Blood pressure 130/78, afebrile,  weight 173.  GENERAL:  No acute distress.  HEENT:  Mucous membranes are moist.  Pupils equal, round, and reactive  to light.  TMs normal.  NECK:  Carotids, no obvious bruits.  Neck is supple.  LUNGS:  Clear.  HEART:  Regular rate and rhythm.  ABDOMEN:  Nontender.  EXTREMITIES:  Thin.  No edema.  Pulses good.   Hemoglobin A1c in the office 11.5%.  EKG pending.   IMPRESSION:  1. Chest pain with multiple risk factors concerning for potential      unstable angina pattern.  2. Type 2 diabetes with poor control and recurrent noncompliance.  I      have advised insulin in the patient  has been very resistant.  3. Hypertension.  4. Hyperlipidemia.  5. History of stroke.  6. History of endarterectomy.   PLAN:  Cardiology consult, __________, serial cardiac enzymes.  Lovenox  therapeutic dosage.  The patient to chew one aspirin in the office  before heading to the hospital.  Further orders as noted in the chart.      Donna Bernard, M.D.  Electronically Signed     WSL/MEDQ  D:  06/13/2008  T:  06/14/2008  Job:  [id number]

## 2010-09-06 NOTE — Op Note (Signed)
[name], [name]                      ACCOUNT NO.:  [id number]   MEDICAL RECORD NO.:  [id number]                   PATIENT TYPE:  INP   LOCATION:  3314                                 FACILITY:  [facility]   PHYSICIAN:  Larina Earthly, M.D.                 DATE OF BIRTH:  02-19-1934   DATE OF PROCEDURE:  10/03/2002  DATE OF DISCHARGE:                                 OPERATIVE REPORT   PREOPERATIVE DIAGNOSIS:  Symptomatic left internal carotid artery stenosis.   POSTOPERATIVE DIAGNOSIS:  Symptomatic left internal carotid artery stenosis.   PROCEDURE:  Left carotid endarterectomy and Dacron patch angioplasty.   SURGEON:  Larina Earthly, M.D.   ASSISTANT:  Quita Skye. Hart Rochester, M.D. and Claudette Royston Sinner, N.P.   ANESTHESIA:  General endotracheal.   COMPLICATIONS:  None.   DISPOSITION:  To recovery room stable.   INDICATIONS FOR PROCEDURE:  This patient is a 77 year old gentleman with an  episode of aphasia approximately two weeks prior.  MRI revealed chronic left  hemispheric stroke and also an acute small stroke. He had no focal deficits  from a motor standpoint, but did have slurred speech which has improved. He  is currently undergoing speech therapy. He had a Duplex revealing critical  stenosis of his left internal carotid artery and it was recommended that he  undergo an endarterectomy to reduce his risk for stroke. The procedure  including perioperative risk of stroke was discussed with the patient and  his family who understood.   DESCRIPTION OF PROCEDURE:  The patient was taken to the operating room and  placed in the supine position where the area of the left neck was prepped  and draped in the usual sterile fashion. Incision was made in the anterior  sternocleidomastoid and carried down through the platysma to the left  carotid. Sternocleidomastoid was reflected posteriorly and the carotid  sheath was opened.  The facial vein was ligated with 3-0 silk ties and  divided.  The patient had a moderate amount of inflammation around the  entire carotid system.  The hypoglossal and vagus nerves were identified and  preserved. The common carotid artery was encircled with umbilical tape and  Rummel tourniquet. The dissection extended down to the bifurcation and the  external carotid encircled with a blue vessel loop. The internal carotid was  encircled with umbilical tape and Rummel tourniquet and the superior thyroid  artery was encircled with a 2-0 silk Potts tie.  The patient was given 8000  units of intravenous heparin.  After adequate circulation time, the internal  and external common carotid arteries were occluded.  The common carotid  artery was opened with an 11 blade and extended longitudinally with Potts  scissors through the plaque onto the internal carotid artery with the Potts  scissors.  A 10 shunt was passed up the internal carotid, allowed to  backbleed, and then down the common carotid which it  was cured with Rummel  tourniquet.  The endarterectomy was begun on the common carotid artery and  the plaque was divided proximally with Potts scissors.  The endarterectomy  was extended on to the bifurcation. The external carotid artery was  endarterectomized with the eversion technique and the internal carotid  artery was endarterectomized in an open fashion. There was a plaque  extending up onto the internal carotid artery and the endarterectomy was  extended further proximally to remove the plaque.  The Finesse Hemashield  Dacron patch was brought onto the field and was sewn as a patch angioplasty  with a running 6-0 Prolene suture. Prior to completion of the anastomosis,  the usual flushing maneuvers were undertaken after removing the shunt.  The  anastomosis was completed.  The external followed by the common and finally  the internal carotid artery occlusion clamps were removed.  Excellent flow  characteristics were noted with handheld  Doppler in the internal and  external carotid arteries.  The patient was given 50 mg of Protamine to  reverse the heparin. The wounds were irrigated with saline. The wounds were  closed with several 3-0 Vicryl sutures to reapproximate the  sternocleidomastoid over the carotid sheath. Next the platysma was closed  with a running 3-0 Vicryl suture and finally the skin was closed with 4-0  subcuticular Vicryl stitch.  Benzoin and Steri-Strips were applied. The  patient was awakened in the operating room and returned to the recovery room  in stable condition.                                               Larina Earthly, M.D.    TFE/MEDQ  D:  10/03/2002  T:  10/03/2002  Job:  [id number]   cc:   Lorin Picket A. Gerda Diss, M.D.  [address], [address]  [place]  [place] [address]  Fax: [phone]

## 2010-09-06 NOTE — Procedures (Signed)
NAMEELMORE, HYSLOP                      ACCOUNT NO.:  [id number]   MEDICAL RECORD NO.:  [id number]                   PATIENT TYPE:  OUT   LOCATION:  RAD                                  FACILITY:  [facility]   PHYSICIAN:  Jae Dire, P.A. [facility]               DATE OF BIRTH:  1933/07/05   DATE OF PROCEDURE:  DATE OF DISCHARGE:                                    STRESS TEST   INDICATION:  The patient is a 77 year old male with a history of carotid  CT's status post CVA and left carotid endarterectomy.  He was referred for  evaluation of abnormal electrocardiogram.  He denies any chest discomfort.  He admits to mild dyspnea on exertion.   BASELINE DATA:  EKG reveals sinus rhythm at 63 beats per minute with  nonspecific ST abnormalities and PAC.  Blood pressure is 140/90.   The patient exercised for a total of 8 minutes and 15 seconds on Bruce  protocol stage 3, and 10.1 mets.   Maximum heart rate was 153 beats per minute which is 100% of predicted  maximum.  Maximum blood pressure was 220/90, resolved down to 162/88 in  recovery.  The patient denied any chest discomfort.  He did have some mild  shortness of breath towards the end of exercise which resolved quickly in  recovery.  EKG revealed a few PVC's, one ventricular couplet and ST-  depression in leads V4 and V5 during exercise which resolved quickly in  recovery.   The test was stopped secondary to fatigue.   Final images results are pending M.D. review.      ___________________________________________                                            Jae Dire, P.A. [facility]   AB/MEDQ  D:  03/22/2003  T:  03/22/2003  Job:  [id number]

## 2010-09-06 NOTE — Discharge Summary (Signed)
[name], [name]          ACCOUNT NO.:  [id number]   MEDICAL RECORD NO.:  [id number]          PATIENT TYPE:  INP   LOCATION:  4739                         FACILITY:  [facility]   PHYSICIAN:  Pricilla Riffle, M.D.    DATE OF BIRTH:  [birth year]   DATE OF ADMISSION:  05/09/2004  DATE OF DISCHARGE:  05/10/2004                           DISCHARGE SUMMARY - REFERRING   PRIMARY CARE PHYSICIAN:  Dr. Gerda Diss.   PRIMARY CARDIOLOGIST:  Vida Roller, M.D.   DISCHARGE DIAGNOSES:  1.  Chest pain with negative cardiac enzymes x2.  2.  Hypertension, poorly controlled.  3.  Diabetes, poorly controlled.  4.  Status post left carotid endarterectomy.   PAST MEDICAL HISTORY:  1.  Family history of coronary artery disease, status post treadmill      Cardiolite in December of 2004 showing small fixed defect in the      inferior wall and at the apex with no wall motion abnormalities and an      EF of 55%.  2.  History of laryngeal cancer, status post x-ray therapy.  3.  Peripheral vascular disease.   HOSPITAL COURSE:  Mr. [name] is a 77 year old African American male with  history as stated above, admitted with complaints of chest pain that started  the evening prior to admission.  The patient presented to the emergency room  at [facility] where he received aspirin and IV heparin.  The  patient was transferred to [facility] for further work-  up.  Initial chest x-ray showing mild cardiomegaly with acute disease.  EKG  revealing normal sinus rhythm rate of 75 with no acute ischemic changes and  no old strips available for comparison.  The patient continued to be  monitored.  Continued previous medications from home with hypertension.  Lotrel dose was increased 10/20 mg a day.  Atenolol was increased to 50 mg  b.i.d.  Point of care markers negative.  Cardiac panel negative.  Chemistries show a sodium of 138, potassium 3.5, glucose 130, BUN 12,  creatinine 0.9.  AST  17, ALT 19.  Hemoglobin A1C of 11.   Dr. Juanda Chance in to see the patient on the day of discharge.  The patient being  discharged home with changes in medication.  She is to follow up with Dr.  Dorethea Clan.  I have scheduled her appointment at the [place],  office May 29, 2004, at 2:15.  She has been given new prescriptions to  increase her atenolol to 50 mg p.o. b.i.d., Nitroglycerin sublingual p.r.n.  Continue her Lipitor 10 mg daily.  Lotrel increased from 5 to 10/20 mg  daily.  I have instructed the patient to continue her Glucotrol.  She will  need further diabetic management with her primary care physician.  Also  schedule her for an outpatient Cardiolite in [place], [place],  prior to follow-up appointment with Dr. Dorethea Clan.       MB/MEDQ  D:  05/10/2004  T:  05/10/2004  Job:  [id number]   cc:   Hilario Quarry, M.D.  [address]  [place]  [place] [address]  Fax: [phone]   Dr. Otho Darner, M.D.  Fax: [phone]

## 2010-09-06 NOTE — H&P (Signed)
NAMESCOTT, VANDERVEER                      ACCOUNT NO.:  [id number]   MEDICAL RECORD NO.:  [id number]                   PATIENT TYPE:  OUT   LOCATION:  RAD                                  FACILITY:  [facility]   PHYSICIAN:  [name] A. Gerda [name], M.D.               DATE OF BIRTH:  05/10/1933   DATE OF ADMISSION:  02/01/2002  DATE OF DISCHARGE:                                HISTORY & PHYSICAL   CHIEF COMPLAINT:  Abdominal pain, right lower quadrant.   HISTORY OF PRESENT ILLNESS:  This is a 77 year old black male who relates up  until three days he had been feeling very good and then started having some  nagging abdominal pain that localized into the right lower quadrant and over  the past couple of days it has become very severe and it hurts whenever he  coughs, sneezes, jars, sits up, or rolls over.  As a matter of fact, it has  woken him up several times at night.  He denies breaking out in any sweats  or having high fevers.  He relates some nausea but no vomiting.  He has had  a bowel movement recently.  He denies any dysuria or urinary frequency and  denies any groin or leg pain.   PAST MEDICAL HISTORY:  HPM, throat cancer with previous surgery in 1987,  type 2 diabetes, and hyperlipidemia.   FAMILY HISTORY:  Diabetes and heart disease.   SOCIAL HISTORY:  He is married with children.  He does not smoke, does not  drink.   ALLERGIES:  None.   REVIEW OF SYSTEMS:  Negative for headaches, sinus pain, joint pain, rash,  chest discomfort, pressure, or SOB.   PHYSICAL EXAMINATION:  GENERAL:  NAD.  VITAL SIGNS:  Blood pressure in the office was 108/72, temperature 98.2,  weight 185-1/2 pounds.  HEENT:  TM's NL, T-NL, poor dentition.  NECK:  Supple.  No masses.  CHEST:  CTA.  No crackles.  HEART:  Regular.  Pulses normal.  SKIN:  Warm, dry.  EXTREMITIES:  No edema.  ABDOMEN:  Slight protrusion.  Positive bowel sounds although slight decrease  in activity.  Also, RLQ pain and  discomfort with some mild guarding and  rebound on that side.   LABORATORY DATA:  WBC 19,000.  MET-7:  Potassium 4.7, BUN 23, creatinine  1.4.   CT scan of the abdomen showed paracecal inflammatory process with some  extraluminal gas.  It is felt most likely to represent a perforated  appendicitis and the wall of the terminal ileum was thickened.  Chest x-ray  is pending.   ASSESSMENT AND PLAN:  Appendicitis with probable perforation.  We will start  intravenous antibiotics.  Blood culture was drawn.  We will go ahead and do  Rocephin 1 g along with Flagyl 500 mg intravenous q.6h. along with  intravenous fluids and surgical consultation with Dr. Elpidio Anis.  Chest x-  ray is  pending currently.  I do feel the patient is stable for surgery and  with his diabetes has a slight increased risk of difficulty healing and  difficulty with postoperative infections.  I do not find any overt sign of  any coronary artery disease of any unstable nature.                                                 [name] A. Gerda [name], M.D.    SAL/MEDQ  D:  02/01/2002  T:  02/02/2002  Job:  [id number]

## 2010-09-06 NOTE — Procedures (Signed)
   [name], [name]                      ACCOUNT NO.:  [id number]   MEDICAL RECORD NO.:  [id number]                   PATIENT TYPE:  INP   LOCATION:  A320                                 FACILITY:  [facility]   PHYSICIAN:  Edward L. Juanetta Gosling, M.D.             DATE OF BIRTH:  1933-07-29   DATE OF PROCEDURE:  02/02/2002  DATE OF DISCHARGE:                                EKG INTERPRETATION   The rhythm is a sinus rhythm with a rate in the 90s.  There are T waves  inferiorly suggestive of previous inferior myocardial infarction.  There is  left axis deviation.  Somewhat slow R-wave progression across the precordium  that may indicate a previous anterior infarction.  There are nonspecific T-  wave abnormalities.  Abnormal electrocardiogram.                                               Oneal Deputy. Juanetta Gosling, M.D.    ELH/MEDQ  D:  02/02/2002  T:  02/03/2002  Job:  [id number]

## 2010-09-06 NOTE — Op Note (Signed)
NAMETARUN, PATCHELL                      ACCOUNT NO.:  [id number]   MEDICAL RECORD NO.:  [id number]                   PATIENT TYPE:  INP   LOCATION:  A320                                 FACILITY:  [facility]   PHYSICIAN:  Jerolyn Shin C. Katrinka Blazing, M.D.                DATE OF BIRTH:  02-07-1934   DATE OF PROCEDURE:  DATE OF DISCHARGE:                                 OPERATIVE REPORT   PREOPERATIVE DIAGNOSIS:  Acute appendicitis.   POSTOPERATIVE DIAGNOSIS:  Acute appendicitis.   PROCEDURE:  Laparoscopic appendectomy.   SURGEON:  Dirk Dress. Katrinka Blazing, M.D.   DESCRIPTION OF PROCEDURE:  Under general anesthesia the patient's abdomen  was prepped and draped in a sterile field.  A supraumbilical incision was  made.  A Veress needle was inserted uneventfully.  The abdomen was  insufflated with 3 liters of CO2.  Using a Vis-A-Port guide a 10-mm port was  placed under videoscopic guidance.  A laparoscope was placed.  There  appeared to be some inflammation in the right gutter and right lower  quadrant.  The pelvis did not appear to show any inflammation.   The patient was placed in deep Trendelenburg position.  Under videoscopic  guidance a 5-mm port was placed.  The cecum was inspected and there was  inflammation at the tip of the cecum and along the lateral gutter with some  small exudate over the tip of the cecum.  There were some chronic adhesions  between the cecum and the lateral peritoneal wall.  There was thickening of  the terminal ileum along that aspect that was closest to the tip of the  cecum.  The terminal as it entered the cecum appeared to be normal,  however.   Using the Endoshears the adhesions between the cecum and the peritoneum were  taken down.  The fat, at the tip of the cecum was thickened.  Intermixed  with this fat having the confluence of patina was a very small, somewhat  rudimentary, though thickened and fibrotic appendage. It appeared to be an  extra thick appendix.  Using blunt dissection the thickened mesoappendix was  dissected. It was serially clipped with Endoclips and divided.  Once this  was taken down to the base of the cecum an EndoGIA was fired across the base  of the cecum taken care to make sure that no major stump was left.   The small appendage was taken out and it appeared to be a very rudimentary  appendix with thickened fat.  Careful evaluation of the tip of the cecum  revealed no other structure.  The posterior aspect of the cecum was further  mobilized and no other structure was found.  A JP drain was placed and  brought out through the suprapubic incision.  Copious irrigation was carried  out.  Pictures were taken.  The CO2 was allowed to escape from the abdomen  and  the ports were removed.  The larger incisions were closed with #0 Dexon.  The skin was closed with staples.  The patient tolerated the procedure well.  She was transferred to a bed and taken to the postanesthetic care unit for  postanesthetic monitoring.                                               Dirk Dress. Katrinka Blazing, M.D.    LCS/MEDQ  D:  02/01/2002  T:  02/02/2002  Job:  [id number]   cc:   Lorin Picket A. Gerda Diss, M.D.

## 2010-09-06 NOTE — H&P (Signed)
NAMEARMANII, PRESSNELL          ACCOUNT NO.:  [id number]   MEDICAL RECORD NO.:  [id number]          PATIENT TYPE:  INP   LOCATION:  4739                         FACILITY:  [facility]   PHYSICIAN:  Thomas C. Wall, M.D.   DATE OF BIRTH:  May 04, 1933   DATE OF ADMISSION:  05/09/2004  DATE OF DISCHARGE:                                HISTORY & PHYSICAL   PRIMARY CARE PHYSICIAN:  Lilyan Punt, M.D.   PRIMARY CARDIOLOGIST:  Vida Roller, M.D.   CHIEF COMPLAINT:  Chest pain.   HISTORY OF PRESENT ILLNESS:  Mr. Meech is a 77 year old African American  male with multiple cardiac risk factors and he had a treadmill Cardiolite  performed in 2004, though it was never catheterized. He had chest pain that  started last p.m. He states that he never had these symptoms before. It is  brief and sharp. It lasts seconds. He began experiencing symptoms at  approximately 3:00 in the afternoon. The symptoms came and went. He went to  bed at about 10:00 and slept well. This  morning he began to have symptoms  again. He contacted our office and was told to go to the emergency room. At  [facility] he received aspirin and IV heparin. He has not had any  episodes since going to the emergency room. He is currently pain free. These  episodes were not associated with exertion and there was no associated  shortness of breath, nausea, vomiting, or diaphoresis.   PAST MEDICAL HISTORY:  1.  Diabetes.  2.  Hypertension.  3.  Hyperlipidemia.  4.  Family history of coronary artery disease.  5.  Status post treadmill Cardiolite in December 2004 showing small fixed      defect in the inferior wall and at the apex with no wall motion      abnormalities, and an ejection fraction of 55%.  6.  History of laryngeal cancer, status post x-ray therapy.  7.  Peripheral vascular disease.   PAST SURGICAL HISTORY:  Status post left carotid endarterectomy and  appendectomy.   ALLERGIES:  No known drug  allergies.   MEDICATIONS:  1.  Atenolol 50 mg daily.  2.  Lipitor 10 mg daily.  3.  Glucotrol 5 mg daily.  4.  Lotrel 5/20 daily.  5.  Aspirin 81 mg daily.   SOCIAL HISTORY:  He lives in [place] alone. His wife died suddenly on New  Year's Day. He is retired from the [employer], but still works part-  time. He quit tobacco in 1987 and does not abuse alcohol or drugs.   FAMILY HISTORY:  His mother died at age 21 with no history of coronary  artery disease. His father died at age 60 secondary to a heart attack. His  sister is alive, but has heart disease.   REVIEW OF SYSTEMS:  Significant for chest pain as described above. He has a  rare cough, but no wheeze. He has rare arthralgias. He denies reflux or  indigestion symptoms. He denies hematemesis, hemoptysis, or melena. Review  of systems is otherwise negative.   PHYSICAL EXAMINATION:  VITAL SIGNS: Temperature  97.6, blood pressure  190/103, heart rate 79, respiratory rate 20, O2 saturation 99% on room air.  GENERAL: He is a well-developed, elderly, African American male in no acute  distress.  HEENT: His head is normocephalic and atraumatic. Pupils equal, round, and  reactive to light and accommodation.  Extraocular movements are intact. Dentition is poor.  NECK: No lymphadenopathy, thyromegaly, or JVD. He has a CEA scar on the  left. No carotid bruits are appreciated.  CV: Regular in rate and rhythm with an S1 and S2 and questionable S4, but no  significant murmur or rub noted. Distal pulses are 2+ bilateral radials. His  DP and PT pulses are slightly decreased and he has loud femoral bruits  bilaterally.  LUNGS: Clear to auscultation bilaterally.  SKIN: No rashes or lesions are noted.  ABDOMEN: Firm and nontender with active bowel sounds and no  hepatosplenomegaly by percussion.  EXTREMITIES: No clubbing, cyanosis, or edema.  MUSCULOSKELETAL: No joint deformities or effusions in his spine or CVA  tenderness.   NEUROLOGIC: He is alert and oriented with cranial nerves II-XII grossly  intact.   Chest x-ray reveals mild cardiomegaly with acute disease. EKG reveals sinus  rhythm, rate 75 with no acute ischemic changes and no old strips available  for comparison.   LABORATORY DATA:  INR 0.9, PTT 29. Point of care markers negative times two.  Sodium 136, potassium 4.2, chloride 107, CO2 27, BUN 13, creatinine 1.0,  glucose 172. Hemoglobin 13.6, hematocrit 39.8, WBC 5.7, platelet count  173,000.   ASSESSMENT/PLAN:  1.  Chest pain: He has atypical symptoms, but multiple cardiac risk factors.      We will admit and rule out MI by cycling enzymes. Continue heparin, add      IV nitroglycerin, and continue aspirin. If cardiac enzymes are positive,      cardiac catheterization is indicated. If cardiac enzymes are negative,      he can be referred back to [facility] for outpatient evaluation,      Cardiolite.  2.  Hypertension: Will increase his beta blocker from atenolol 50 mg daily      to 50 mg b.i.d.  Will also increase his Norvasc from 5 mg to 10 mg      daily. If these medication changes are tolerated well and blood pressure      control is still suboptimal, consider increasing Lotensin.  3.  Peripheral vascular disease: He has no claudication symptoms. He states      that he is active and is able to mow to his own grass, and do all the      walking that he needs to do without any symptoms. Further evaluation      will be on a p.r.n. basis.  4.  Diabetes: Will check a hemoglobin A1C and continue his home medication.  5.  Hyperlipidemia: Will check a fasting lipid profile in the a.m.   Dr. Valera Castle saw the patient and determined the plan of care.      RB/MEDQ  D:  05/09/2004  T:  05/09/2004  Job:  [id number]

## 2010-09-06 NOTE — Discharge Summary (Signed)
NAMEDONTAI, PEMBER          ACCOUNT NO.:  [id number]   MEDICAL RECORD NO.:  [id number]          PATIENT TYPE:  INP   LOCATION:  A301                          FACILITY:  [facility]   PHYSICIAN:  Donna Bernard, M.D.DATE OF BIRTH:  02-18-1934   DATE OF ADMISSION:  06/13/2008  DATE OF DISCHARGE:  02/25/2010LH                               DISCHARGE SUMMARY   FINAL DIAGNOSES:  1. Chest pain, myocardial infarction ruled out.  2. Negative Myoview stress test.  3. Type 2 diabetes, poor control.  4. Hypertension.  5. Hyperlipidemia.   DISPOSITION:  1. Patient discharged to home.  2. Patient will follow up in the office in 1 week.  3. Patient to take all chronic medications as noted.  4. Patient to add Lantus 15 units each evening.  5. New dose on cholesterol medicine, simvastatin 40 mg daily.  6. New dose on blood pressure pill, Lotrel 10/20 one daily.   INITIAL HISTORY AND PHYSICAL:  Please see history and physical as  dictated.   HOSPITAL COURSE:  This patient is a 77 year old black gentleman with  history of hypertension, prior stroke, type 2 diabetes, hyperlipidemia,  and recurrent noncompliance who presented to the office on day of  admission with complaints of chest pain.  The pain was atypical in  nature.  However, he did have multiple risk factors.  He was admitted to  hospital.  Cardiology folks were consulted.  They did a stress Myoview.  This was eventually negative.  His EKGs were nonspecific.  Serial  cardiac enzymes were negative.  The patient's sugars were too high.  A1c  in the office was 11.5%.  Glucose screenings were done and insulin was  given as needed through the sliding scale approach.  On day of  discharge, the patient was feeling better and discharged home.   DIAGNOSIS AND DISPOSITION:  As noted above.      Donna Bernard, M.D.  Electronically Signed     WSL/MEDQ  D:  07/07/2008  T:  07/08/2008  Job:  [id number]

## 2010-09-06 NOTE — H&P (Signed)
   [name], [name]                      ACCOUNT NO.:  [id number]   MEDICAL RECORD NO.:  [id number]                   PATIENT TYPE:  INP   LOCATION:  NA                                   FACILITY:  [facility]   PHYSICIAN:  Larina Earthly, M.D.                 DATE OF BIRTH:  11-16-1933   DATE OF ADMISSION:  10/03/2002  DATE OF DISCHARGE:                                HISTORY & PHYSICAL   CHIEF COMPLAINT:  Symptomatic left internal carotid artery stenosis.   HISTORY OF PRESENT ILLNESS:  The patient is a 77 year old black male who  approximately on June 1 was noted to have episode of slurred speech,  presented to his physician, Dr. Gerda Diss, in [place] and underwent a MRI  that showed both evidence of old and new stroke on the left. Also had  evidence of diminished flow in his carotid system and underwent carotid  Duplex revealing severe stenosis in his left internal carotid artery. He had  no prior neurological deficits and specifically denied any other focal  neurological deficits at this time.   PAST MEDICAL HISTORY:  1. Significant for hypertension.  2. Noninsulin-dependent diabetes.   SOCIAL HISTORY:  Negative for smoking. He denies any evidence of premature  cardiac disease in his family. He is a relatively poor historian.   PHYSICAL EXAMINATION:  GENERAL:  He is a well-developed, well-nourished,  black male appearing somewhat older than his stated age of 66.  NEUROLOGICAL:  He is grossly intact neurologically aside from some word  searching. His carotid arteries have bruits bilaterally. He has 2+ radial  pulses, 2+ femoral, 2+ dorsalis pedis bilaterally.  HEART:  His heart is regular rhythm.  CHEST:  His chest is clear bilaterally.  ABDOMEN:  His abdomen is benign with no masses and no tenderness.   He underwent repeat carotid Duplex revealing severe left internal carotid  artery stenosis.   MEDICATIONS:  1. Avandia 8 mg p.o. q.d.  2. Glucotrol XL 10 mg b.i.d.  3.  Hydrochlorothiazide 25 mg one half tablet q.d.  4. Lotrel 5/20 one p.o. q.d.   IMPRESSION:  Symptomatic left internal carotid artery stenosis.   PLAN:  The patient will be admitted for left carotid endarterectomy on June  14. The procedure included expected recovery time and potential  complications including perioperative stroke were discussed with the patient  and his family and understand.                                              Larina Earthly, M.D.   TFE/MEDQ  D:  09/29/2002  T:  09/30/2002  Job:  [id number]

## 2010-09-06 NOTE — Discharge Summary (Signed)
NAMEWEBSTER, PATRONE                      ACCOUNT NO.:  [id number]   MEDICAL RECORD NO.:  [id number]                   PATIENT TYPE:  INP   LOCATION:  A320                                 FACILITY:  [facility]   PHYSICIAN:  Jerolyn Shin C. Katrinka Blazing, M.D.                DATE OF BIRTH:  May 08, 1933   DATE OF ADMISSION:  02/01/2002  DATE OF DISCHARGE:  02/15/2002                                 DISCHARGE SUMMARY   DISCHARGE DIAGNOSES:  1. Acute appendicitis.  2. Paralytic ileus.  3. Hypertension.  4. Diabetes mellitus.  5. History of laryngeal carcinoma status post radiation therapy.  6. Hyperlipidemia.   SPECIAL PROCEDURE:  Laparoscopic appendectomy.   DISPOSITION:  The patient discharged home in stable improved condition.   DISCHARGE MEDICATIONS:  1. Lotrel 5/20 q.d.  2. Hydrochlorothiazide 25 mg one-half tablet q.d.  3. Glucotrol XL 10 mg b.i.d.  4. Tylox one to two q.4h. p.r.n. pain.  5. Milk of Magnesia two tablespoons b.i.d. for three days.   SUMMARY:  The patient is a 77 year old male admitted by Dr. Lilyan Punt  with a three-day history of right lower quadrant pain without nausea or  vomiting.  The pain was more severe and was localized to the right lower  quadrant.  CT scan showed inflammation of the right lower quadrant with  pericecal air which was extraluminal.  The patient was otherwise stable.  He  was counseled for the surgery and on the evening of admission underwent  laparoscopy.  He was found to have a pericecal inflammation and thickened  mesoappendix with small appendix.  Laparoscopic appendectomy was done.  The  area was drained with JP drain and the patient was treated in the  postoperative period with IV antibiotics.  In the early postoperative period  he had nausea and fever with leukocytosis.  He developed a postoperative  ileus that required increased manipulation.  He became markedly distended.  There as a period of improvement and then he had more nausea and  vomiting.  The patient was having bowel movements and flatus despite having markedly  distended bowel.  He was started on Alophen and given oral laxatives.  With  this combination he improved, though he had a prolonged postoperative  course.  There was no other problem noted  during this period.  Because of prolonged ileus, we considered TPN but  finally elected to not to do it.  His bowels improved.  He started  tolerating a diet, and he became afebrile.  He was subsequently discharged  on October 28 in stable condition though he was somewhat weakened from his  prolonged ileus.                                               Dirk Dress. Katrinka Blazing, M.D.  LCS/MEDQ  D:  04/09/2002  T:  04/11/2002  Job:  [id number]   cc:   Lorin Picket A. Gerda Diss, M.D.  [address], [address]  [place]  [place] [address]  Fax: [phone]

## 2010-09-17 ENCOUNTER — Other Ambulatory Visit: Payer: Self-pay | Admitting: *Deleted

## 2010-09-17 MED ORDER — ATORVASTATIN CALCIUM 40 MG PO TABS: 40.0000 mg | ORAL_TABLET | Freq: Every day | ORAL | Status: DC

## 2010-10-02 ENCOUNTER — Ambulatory Visit (INDEPENDENT_AMBULATORY_CARE_PROVIDER_SITE_OTHER): Payer: Medicare Other

## 2010-10-02 DIAGNOSIS — I10 Essential (primary) hypertension: Secondary | ICD-10-CM

## 2010-10-02 DIAGNOSIS — E785 Hyperlipidemia, unspecified: Secondary | ICD-10-CM

## 2010-10-02 NOTE — Progress Notes (Signed)
**Note De-Identified Catha Ontko Obfuscation** S: Pt. arrives in office for a 3 month BP check with nurse B: On last OV with Dr. Dietrich Pates on 07-02-10 pt. was advised to increase Amlodipine to 10mg  qd A: Pt. has no complaints at this time. His BP this morning is 127/66 and on last OV BP was 138/77. He brought his BP diary (placed in Dr. Marvel Plan records folder) and all of his medication bottles (he is taking as directed) R: Pt. Advised to continue current medical treatment and that we will contact him with Dr. Marvel Plan recommendations, if any.  10/06/10  Blood pressure control is quite good; continue current therapy.       [name], M.D. 10-07-10 Pt. Advised, he states he understands instruction given.       Larita Fife Makenze Ellett, LPN

## 2010-10-07 ENCOUNTER — Encounter: Payer: Self-pay | Admitting: Cardiology

## 2010-10-14 ENCOUNTER — Other Ambulatory Visit: Payer: Self-pay

## 2010-10-14 MED ORDER — AMLODIPINE BESYLATE 10 MG PO TABS: 10.0000 mg | ORAL_TABLET | Freq: Every day | ORAL | Status: DC

## 2010-10-17 NOTE — Progress Notes (Deleted)
  Blood pressure control is good; continue current medication      Talbot [name] M.D.  10/17/2010  _______________________

## 2010-10-18 NOTE — Progress Notes (Signed)
Sent to Dr. Dietrich Pates to sign

## 2011-01-03 ENCOUNTER — Other Ambulatory Visit: Payer: Self-pay | Admitting: *Deleted

## 2011-01-03 MED ORDER — LISINOPRIL 40 MG PO TABS: 40.0000 mg | ORAL_TABLET | Freq: Every day | ORAL | Status: DC

## 2011-01-03 MED ORDER — CARVEDILOL 12.5 MG PO TABS: 25.0000 mg | ORAL_TABLET | Freq: Two times a day (BID) | ORAL | Status: DC

## 2011-01-16 ENCOUNTER — Other Ambulatory Visit: Payer: Self-pay | Admitting: *Deleted

## 2011-01-16 MED ORDER — CLOPIDOGREL BISULFATE 75 MG PO TABS: 75.0000 mg | ORAL_TABLET | Freq: Every day | ORAL | Status: DC

## 2011-02-21 ENCOUNTER — Other Ambulatory Visit: Payer: Self-pay | Admitting: *Deleted

## 2011-02-21 MED ORDER — AMLODIPINE BESYLATE 10 MG PO TABS: 10.0000 mg | ORAL_TABLET | Freq: Every day | ORAL | Status: DC

## 2011-02-24 ENCOUNTER — Other Ambulatory Visit: Payer: Self-pay | Admitting: *Deleted

## 2011-02-24 MED ORDER — AMLODIPINE BESYLATE 10 MG PO TABS: 10.0000 mg | ORAL_TABLET | Freq: Every day | ORAL | Status: DC

## 2011-04-14 ENCOUNTER — Other Ambulatory Visit: Payer: Self-pay

## 2011-04-14 MED ORDER — CLOPIDOGREL BISULFATE 75 MG PO TABS: 75.0000 mg | ORAL_TABLET | Freq: Every day | ORAL | Status: DC

## 2011-05-29 ENCOUNTER — Other Ambulatory Visit: Payer: Self-pay | Admitting: *Deleted

## 2011-05-29 MED ORDER — CLOPIDOGREL BISULFATE 75 MG PO TABS: 75.0000 mg | ORAL_TABLET | Freq: Every day | ORAL | Status: DC

## 2011-06-17 ENCOUNTER — Other Ambulatory Visit: Payer: Self-pay | Admitting: *Deleted

## 2011-06-17 MED ORDER — CLOPIDOGREL BISULFATE 75 MG PO TABS: 75.0000 mg | ORAL_TABLET | Freq: Every day | ORAL | Status: DC

## 2011-09-03 ENCOUNTER — Other Ambulatory Visit: Payer: Self-pay | Admitting: *Deleted

## 2011-09-03 MED ORDER — AMLODIPINE BESYLATE 10 MG PO TABS: 10.0000 mg | ORAL_TABLET | Freq: Every day | ORAL | Status: DC

## 2011-09-04 ENCOUNTER — Other Ambulatory Visit: Payer: Self-pay | Admitting: Family Medicine

## 2011-09-04 ENCOUNTER — Ambulatory Visit (HOSPITAL_COMMUNITY)
Admission: RE | Admit: 2011-09-04 | Discharge: 2011-09-04 | Disposition: A | Discharge status: Home / Self Care | Payer: Medicare Other | Source: Ambulatory Visit | Attending: Family Medicine | Admitting: Family Medicine

## 2011-09-04 DIAGNOSIS — M79644 Pain in right finger(s): Secondary | ICD-10-CM

## 2011-09-04 DIAGNOSIS — M7989 Other specified soft tissue disorders: Secondary | ICD-10-CM | POA: Insufficient documentation

## 2011-09-04 DIAGNOSIS — M79609 Pain in unspecified limb: Secondary | ICD-10-CM | POA: Insufficient documentation

## 2011-09-04 DIAGNOSIS — M898X9 Other specified disorders of bone, unspecified site: Secondary | ICD-10-CM | POA: Insufficient documentation

## 2011-09-08 ENCOUNTER — Encounter: Payer: Self-pay | Admitting: Adult Health

## 2011-09-08 ENCOUNTER — Ambulatory Visit (INDEPENDENT_AMBULATORY_CARE_PROVIDER_SITE_OTHER): Payer: Medicare Other | Admitting: Adult Health

## 2011-09-08 VITALS — BP 181/90 | HR 80 | Resp 18 | Ht 70.0 in | Wt 172.0 lb

## 2011-09-08 DIAGNOSIS — I10 Essential (primary) hypertension: Secondary | ICD-10-CM

## 2011-09-08 DIAGNOSIS — I2510 Atherosclerotic heart disease of native coronary artery without angina pectoris: Secondary | ICD-10-CM

## 2011-09-08 DIAGNOSIS — E785 Hyperlipidemia, unspecified: Secondary | ICD-10-CM

## 2011-09-08 MED ORDER — AMLODIPINE BESYLATE 10 MG PO TABS: 10.0000 mg | ORAL_TABLET | Freq: Every day | ORAL | Status: AC

## 2011-09-08 MED ORDER — CLOPIDOGREL BISULFATE 75 MG PO TABS: 75.0000 mg | ORAL_TABLET | Freq: Every day | ORAL | Status: DC

## 2011-09-08 MED ORDER — HYDROCHLOROTHIAZIDE 25 MG PO TABS: 25.0000 mg | ORAL_TABLET | Freq: Every day | ORAL | Status: AC

## 2011-09-08 MED ORDER — CARVEDILOL 25 MG PO TABS: 25.0000 mg | ORAL_TABLET | Freq: Two times a day (BID) | ORAL | Status: DC

## 2011-09-08 MED ORDER — LISINOPRIL 40 MG PO TABS: 40.0000 mg | ORAL_TABLET | Freq: Every day | ORAL | Status: DC

## 2011-09-08 MED ORDER — ATORVASTATIN CALCIUM 80 MG PO TABS: 80.0000 mg | ORAL_TABLET | Freq: Every day | ORAL | Status: DC

## 2011-09-08 NOTE — Patient Instructions (Signed)
**Note De-identified Maythe Deramo Obfuscation** Your physician recommends that you continue on your current medications as directed. Please refer to the Current Medication list given to you today.  Your physician recommends that you schedule a follow-up appointment in: 1 year  

## 2011-09-08 NOTE — Assessment & Plan Note (Signed)
His BP is elevated today, but he has not taken his medications yet. I have advised him to take them everyday in the morning. He prefers to eat before taking them. I have asked him to eat breakfast or something light so that the medications can be taken in the morning. He is given refills on his cardiac medications.

## 2011-09-08 NOTE — Progress Notes (Signed)
HPI: [name] is a 78 y/o patient of Dr. Dietrich [name] we are seeing for annual follow-up with known history of CAD with DES to the RCA in 2010, EF of 60%, hypertension,hypercholesterolemia. CVA with prior endarterectomy in 2004, and Diabetes. He was recently seen in bv PCP for injury to his left thumb with X-ray negative for fx. He denies chest pain, DOE, dizziness or near syncope. He has not taken his medications today as he usually waits until he has eaten.  No Known Allergies  Current Outpatient Prescriptions  Medication Sig Dispense Refill  . amLODipine (NORVASC) 10 MG tablet Take 1 tablet (10 mg total) by mouth daily.  30 tablet  11  . aspirin 81 MG EC tablet Take 81 mg by mouth daily.        Marland Kitchen atorvastatin (LIPITOR) 80 MG tablet Take 1 tablet (80 mg total) by mouth daily.  30 tablet  11  . carvedilol (COREG) 25 MG tablet Take 1 tablet (25 mg total) by mouth 2 (two) times daily with a meal.  60 tablet  11  . clopidogrel (PLAVIX) 75 MG tablet Take 1 tablet (75 mg total) by mouth daily. Pt. Is due for OV and needs to schedule  30 tablet  11  . glipiZIDE (GLUCOTROL) 10 MG tablet Take 10 mg by mouth 2 (two) times daily before a meal.        . hydrochlorothiazide (HYDRODIURIL) 25 MG tablet Take 1 tablet (25 mg total) by mouth daily.  30 tablet  11  . lisinopril (PRINIVIL,ZESTRIL) 40 MG tablet Take 1 tablet (40 mg total) by mouth daily.  30 tablet  11  . metFORMIN (GLUCOPHAGE) 500 MG tablet Take 1,000 mg by mouth 2 (two) times daily with a meal.       . DISCONTD: amLODipine (NORVASC) 10 MG tablet Take 1 tablet (10 mg total) by mouth daily.  30 tablet  0  . DISCONTD: atorvastatin (LIPITOR) 80 MG tablet Take 80 mg by mouth daily.      Marland Kitchen DISCONTD: carvedilol (COREG) 12.5 MG tablet Take 2 tablets (25 mg total) by mouth 2 (two) times daily with a meal.  60 tablet  9  . DISCONTD: hydrochlorothiazide 25 MG tablet Take 25 mg by mouth daily.        Marland Kitchen DISCONTD: lisinopril (PRINIVIL,ZESTRIL) 40 MG tablet  Take 1 tablet (40 mg total) by mouth daily.  30 tablet  9  . DISCONTD: atorvastatin (LIPITOR) 40 MG tablet Take 1 tablet (40 mg total) by mouth daily.  30 tablet  3      WUJ:WJXBJY of systems complete and found to be negative unless listed above  PHYSICAL EXAM BP 181/90  Pulse 80  Resp 18  Ht 5\' 10"  (1.778 m)  Wt 172 lb (78.019 kg)  BMI 24.68 kg/m2  General: Well developed, well nourished, in no acute distress, edentulous. Head: Eyes PERRLA, No xanthomas.   Normal cephalic and atramatic  Lungs: Clear bilaterally to auscultation and percussion. Heart: HRRR S1 S2, without MRG.  Pulses are 2+ & equal.            Soft right carotid bruit. No JVD.  No abdominal bruits. No femoral bruits. Abdomen: Bowel sounds are positive, abdomen soft and non-tender without masses or                  Hernia's noted. Msk:  Back normal, normal gait. Normal strength and tone for age. Extremities: No clubbing, cyanosis, left thumb with mild swelling  and pain with ROM.  DP +1 Neuro: Alert and oriented X 3. Psych:  Good affect, responds appropriately    ASSESSMENT AND PLAN

## 2011-09-08 NOTE — Assessment & Plan Note (Signed)
He denies any cardiac symptoms. No plans to order any testing at this time. He is to continue plavix, ASA, coreg and statin.

## 2011-09-08 NOTE — Assessment & Plan Note (Signed)
Followed by Dr. Luking 

## 2012-07-13 ENCOUNTER — Encounter: Payer: Self-pay | Admitting: Family Medicine

## 2012-07-13 ENCOUNTER — Ambulatory Visit (INDEPENDENT_AMBULATORY_CARE_PROVIDER_SITE_OTHER): Payer: Medicare Other | Admitting: Family Medicine

## 2012-07-13 VITALS — BP 132/78 | HR 80 | Temp 97.7°F | Wt 173.0 lb

## 2012-07-13 DIAGNOSIS — I10 Essential (primary) hypertension: Secondary | ICD-10-CM

## 2012-07-13 DIAGNOSIS — E119 Type 2 diabetes mellitus without complications: Secondary | ICD-10-CM

## 2012-07-13 DIAGNOSIS — E785 Hyperlipidemia, unspecified: Secondary | ICD-10-CM

## 2012-07-13 DIAGNOSIS — N400 Enlarged prostate without lower urinary tract symptoms: Secondary | ICD-10-CM

## 2012-07-13 MED ORDER — TAMSULOSIN HCL 0.4 MG PO CAPS: 0.4000 mg | ORAL_CAPSULE | Freq: Every day | ORAL | Status: AC

## 2012-07-14 NOTE — Progress Notes (Signed)
  Subjective:    Patient ID: [name], male    DOB: 05-May-1933, 79 y.o.   MRN: [id number]  HPI Patient arrives office for acute concerns and chronic ones. He has a history of type 2 diabetes. Reports her sugars overall have been in decent control. He is getting up more more frequently at night to urinate. No true dysuria. This is been progressive over several years. Known history of coronary artery disease. No chest pain. Claims compliance with his other medications. Please see list.   Review of Systems  All other systems reviewed and are negative.       Objective:   Physical Exam Alert HEENT normal. Lungs clear. Heart regular rate and rhythm. No CVA tenderness. Abdomen benign. Prostate diffusely enlarged. Does not appear infected. Feet sensation intact pulses good. No edema.       Assessment & Plan:  Impression #1 prostate hypertrophy-discussed. We'll attempt medication. #2 hypertension good control. #3 hyperlipidemia status uncertain. #4 coronary artery disease ongoing. #5 micro-proteinuria. #6 peripheral arterial disease. #7 history of stroke. Plan as per orders. Diet exercise discussed. Encouraged to maintain meds. Add Flomax 0.4 each bedtime.

## 2012-07-27 ENCOUNTER — Telehealth: Payer: Self-pay | Admitting: Family Medicine

## 2012-07-27 DIAGNOSIS — Z79899 Other long term (current) drug therapy: Secondary | ICD-10-CM

## 2012-07-27 DIAGNOSIS — E785 Hyperlipidemia, unspecified: Secondary | ICD-10-CM

## 2012-07-27 NOTE — Telephone Encounter (Signed)
Blood work papers printed and left up front for patient to pick up. Patient notified and stated he will call back to schedule office visit.

## 2012-07-27 NOTE — Telephone Encounter (Deleted)
Blood work papers printed and left up from

## 2012-07-27 NOTE — Telephone Encounter (Signed)
Grand-Daughter wants to know when [name] is due for blood work.  States Grand-Father had her to call.

## 2012-07-27 NOTE — Telephone Encounter (Signed)
Due this mo--plus ov. Lip liv met-7

## 2012-07-30 ENCOUNTER — Encounter: Payer: Self-pay | Admitting: *Deleted

## 2012-08-05 ENCOUNTER — Encounter: Payer: Self-pay | Admitting: Family Medicine

## 2012-08-05 ENCOUNTER — Ambulatory Visit (INDEPENDENT_AMBULATORY_CARE_PROVIDER_SITE_OTHER): Payer: Medicare Other | Admitting: Family Medicine

## 2012-08-05 VITALS — BP 162/108 | HR 70 | Wt 169.1 lb

## 2012-08-05 DIAGNOSIS — E785 Hyperlipidemia, unspecified: Secondary | ICD-10-CM

## 2012-08-05 DIAGNOSIS — Z79899 Other long term (current) drug therapy: Secondary | ICD-10-CM

## 2012-08-05 DIAGNOSIS — I10 Essential (primary) hypertension: Secondary | ICD-10-CM

## 2012-08-05 DIAGNOSIS — E119 Type 2 diabetes mellitus without complications: Secondary | ICD-10-CM

## 2012-08-05 LAB — POCT GLYCOSYLATED HEMOGLOBIN (HGB A1C): Hemoglobin A1C: 7.8

## 2012-08-05 NOTE — Patient Instructions (Signed)
Get a medicine organizer and have one of your children help you weekly with this--it will help you remember meds.

## 2012-08-05 NOTE — Progress Notes (Signed)
  Subjective:    Patient ID: [name], male    DOB: June 04, 1933, 79 y.o.   MRN: [id number]  Diabetes   Patient arrives for followup of his many health concerns. No new stroke activity. Patient claims no chest pain. States no shortness of breath with exertion. Claims compliance with all of his medications. Reports most of his sugars 120 to 1:30 in the morning. Trying to watch his diet. Has cut down her salt intake.  Review of Systems ROS otherwise negative.    Objective:   Physical Exam  Results for orders placed in visit on 08/05/12  POCT GLYCOSYLATED HEMOGLOBIN (HGB A1C)      Result Value Range   Hemoglobin A1C 7.8     Alert no acute distress. HEENT normal. Lungs clear no crackles or wheezes. Heart regular in rhythm. Ankles without edema. Sensation intact. Pulses diminished.      Assessment & Plan:  Impression #1 type 2 diabetes control good but not perfect-discussed. #2 hypertension good control. #3 hyperlipidemia status uncertain. #4 coronary artery disease clinically stable. #5 status post stroke discussed. #6 history micro-proteinuria. Plan appropriate blood work. Will maintain diabetes tablets same level. Symptomatic care discussed. Diet exercise discussed. Further recommendations based on blood work. WSL

## 2012-08-10 LAB — HEPATIC FUNCTION PANEL
AST: 16 U/L (ref 0–37)
Alkaline Phosphatase: 41 U/L (ref 39–117)
Bilirubin, Direct: 0.2 mg/dL (ref 0.0–0.3)
Total Bilirubin: 0.9 mg/dL (ref 0.3–1.2)

## 2012-08-10 LAB — LIPID PANEL
HDL: 38 mg/dL — ABNORMAL LOW (ref >39)
LDL Cholesterol: 73 mg/dL (ref 0–99)

## 2012-08-10 LAB — BASIC METABOLIC PANEL
CO2: 29 mEq/L (ref 19–32)
Calcium: 9.2 mg/dL (ref 8.4–10.5)
Chloride: 107 mEq/L (ref 96–112)
Creat: 1.38 mg/dL — ABNORMAL HIGH (ref 0.50–1.35)
Glucose, Bld: 84 mg/dL (ref 70–99)

## 2012-08-12 ENCOUNTER — Encounter: Payer: Self-pay | Admitting: Family Medicine

## 2012-08-18 ENCOUNTER — Encounter: Payer: Self-pay | Admitting: Family Medicine

## 2012-08-20 ENCOUNTER — Telehealth: Payer: Self-pay | Admitting: Family Medicine

## 2012-08-20 NOTE — Telephone Encounter (Signed)
Aviva plus test strips called into [place]

## 2012-08-20 NOTE — Telephone Encounter (Signed)
Nurse: Ilda Basset: [facility] CB:[phone]  Message: Pharmacy indicated that the patient needed to contact the provider for a refill on his diabetic strips -- Patient is totally out

## 2012-08-23 ENCOUNTER — Emergency Department (HOSPITAL_COMMUNITY): Payer: Medicare Other

## 2012-08-23 ENCOUNTER — Encounter (HOSPITAL_COMMUNITY): Payer: Self-pay | Admitting: *Deleted

## 2012-08-23 ENCOUNTER — Emergency Department (HOSPITAL_COMMUNITY)
Admission: EM | Admit: 2012-08-23 | Discharge: 2012-08-23 | Disposition: A | Discharge status: Home / Self Care | Payer: Medicare Other | Attending: Emergency Medicine | Admitting: Emergency Medicine

## 2012-08-23 DIAGNOSIS — R109 Unspecified abdominal pain: Secondary | ICD-10-CM

## 2012-08-23 DIAGNOSIS — Z7982 Long term (current) use of aspirin: Secondary | ICD-10-CM | POA: Insufficient documentation

## 2012-08-23 DIAGNOSIS — Z79899 Other long term (current) drug therapy: Secondary | ICD-10-CM | POA: Insufficient documentation

## 2012-08-23 DIAGNOSIS — E119 Type 2 diabetes mellitus without complications: Secondary | ICD-10-CM | POA: Insufficient documentation

## 2012-08-23 DIAGNOSIS — I2510 Atherosclerotic heart disease of native coronary artery without angina pectoris: Secondary | ICD-10-CM | POA: Insufficient documentation

## 2012-08-23 DIAGNOSIS — R1084 Generalized abdominal pain: Secondary | ICD-10-CM | POA: Insufficient documentation

## 2012-08-23 DIAGNOSIS — Z7902 Long term (current) use of antithrombotics/antiplatelets: Secondary | ICD-10-CM | POA: Insufficient documentation

## 2012-08-23 DIAGNOSIS — I10 Essential (primary) hypertension: Secondary | ICD-10-CM | POA: Insufficient documentation

## 2012-08-23 DIAGNOSIS — Z8679 Personal history of other diseases of the circulatory system: Secondary | ICD-10-CM | POA: Insufficient documentation

## 2012-08-23 DIAGNOSIS — E785 Hyperlipidemia, unspecified: Secondary | ICD-10-CM | POA: Insufficient documentation

## 2012-08-23 DIAGNOSIS — Z87891 Personal history of nicotine dependence: Secondary | ICD-10-CM | POA: Insufficient documentation

## 2012-08-23 DIAGNOSIS — Z85819 Personal history of malignant neoplasm of unspecified site of lip, oral cavity, and pharynx: Secondary | ICD-10-CM | POA: Insufficient documentation

## 2012-08-23 LAB — LIPASE, BLOOD: Lipase: 22 U/L (ref 11–59)

## 2012-08-23 LAB — CBC WITH DIFFERENTIAL/PLATELET
Basophils Absolute: 0 10*3/uL (ref 0.0–0.1)
Basophils Relative: 0 % (ref 0–1)
Eosinophils Absolute: 0 10*3/uL (ref 0.0–0.7)
HCT: 35.5 % — ABNORMAL LOW (ref 39.0–52.0)
MCH: 27.8 pg (ref 26.0–34.0)
MCHC: 34.6 g/dL (ref 30.0–36.0)
Monocytes Absolute: 0.2 10*3/uL (ref 0.1–1.0)
Monocytes Relative: 3 % (ref 3–12)
Neutro Abs: 6.4 10*3/uL (ref 1.7–7.7)
Neutrophils Relative %: 85 % — ABNORMAL HIGH (ref 43–77)
RDW: 13.4 % (ref 11.5–15.5)

## 2012-08-23 LAB — URINALYSIS, ROUTINE W REFLEX MICROSCOPIC
Glucose, UA: 100 mg/dL — AB
Leukocytes, UA: NEGATIVE
Nitrite: NEGATIVE
Specific Gravity, Urine: >1.03 — ABNORMAL HIGH (ref 1.005–1.030)
pH: 6 (ref 5.0–8.0)

## 2012-08-23 LAB — COMPREHENSIVE METABOLIC PANEL
AST: 16 U/L (ref 0–37)
Albumin: 3.5 g/dL (ref 3.5–5.2)
BUN: 21 mg/dL (ref 6–23)
Chloride: 104 mEq/L (ref 96–112)
Creatinine, Ser: 1.25 mg/dL (ref 0.50–1.35)
Total Bilirubin: 0.7 mg/dL (ref 0.3–1.2)
Total Protein: 6.7 g/dL (ref 6.0–8.3)

## 2012-08-23 LAB — URINE MICROSCOPIC-ADD ON

## 2012-08-23 MED ORDER — PANTOPRAZOLE SODIUM 20 MG PO TBEC: 20.0000 mg | DELAYED_RELEASE_TABLET | Freq: Every day | ORAL | Status: AC

## 2012-08-23 NOTE — ED Notes (Signed)
Flank pain, cough.  HTN - has not taken BP meds x 2 days as he ran out.  States he got refills today.

## 2012-08-23 NOTE — ED Notes (Signed)
Bil. Flank pain since 3 am.  Denies injury.alert,   Cough for 1 week.No fever.  No urinary sx, No NVD

## 2012-08-23 NOTE — ED Notes (Signed)
Patient with no complaints at this time. Respirations even and unlabored. Skin warm/dry. Discharge instructions reviewed with patient at this time. Patient given opportunity to voice concerns/ask questions. Patient discharged at this time and left Emergency Department with steady gait.   

## 2012-08-23 NOTE — ED Provider Notes (Signed)
History    This chart was scribed for [name] Lennert, MD by Leone Payor, ED Scribe. This patient was seen in room APA03/APA03 and the patient's care was started 1:30 PM.   CSN: [id number]  Arrival date & time 08/23/12  1140   None     Chief Complaint  Patient presents with  . Flank Pain    Patient is a 79 y.o. male presenting with abdominal pain. The history is provided by the patient. No language interpreter was used.  Abdominal Pain Pain location:  Generalized Pain radiates to:  Does not radiate Pain severity:  Mild Onset quality:  Gradual Duration:  1 day Timing:  Constant Progression:  Unchanged Chronicity:  New Relieved by:  None tried Associated symptoms: no diarrhea, no fatigue, no fever, no hematuria, no nausea and no vomiting      [name] is a 79 y.o. male who presents to the Emergency Department complaining of new, unchanged, constant abdominal pain starting about 10 hours ago. He denies fever, nausea, vomiting, diarrhea. Pt has h/o HTN, DM, CAD, HLD, PAD. Pt is a former smoker but denies alcohol use.    Past Medical History  Diagnosis Date  . Hypertension   . Diabetes mellitus without complication     Type 2  . Hyperlipidemia   . CAD (coronary artery disease)   . PAD (peripheral artery disease)   . Throat cancer     Past Surgical History  Procedure Laterality Date  . Throat surgery      Family History  Problem Relation Age of Onset  . Diabetes Mother   . Heart attack Brother     History  Substance Use Topics  . Smoking status: Former Games developer  . Smokeless tobacco: Not on file  . Alcohol Use: No      Review of Systems  Constitutional: Negative for fever, appetite change and fatigue.  HENT: Negative for congestion, sinus pressure and ear discharge.   Eyes: Negative for discharge.  Gastrointestinal: Positive for abdominal pain. Negative for nausea, vomiting and diarrhea.  Genitourinary: Negative for frequency and hematuria.   Musculoskeletal: Negative for back pain.  Skin: Negative for rash.  Neurological: Negative for seizures.  Psychiatric/Behavioral: Negative for hallucinations.  All other systems reviewed and are negative.    Allergies  Review of patient's allergies indicates no known allergies.  Home Medications   Current Outpatient Rx  Name  Route  Sig  Dispense  Refill  . amLODipine (NORVASC) 10 MG tablet   Oral   Take 1 tablet (10 mg total) by mouth daily.   30 tablet   11   . aspirin 81 MG EC tablet   Oral   Take 81 mg by mouth daily.           Marland Kitchen atorvastatin (LIPITOR) 80 MG tablet   Oral   Take 1 tablet (80 mg total) by mouth daily.   30 tablet   11   . carvedilol (COREG) 25 MG tablet   Oral   Take 1 tablet (25 mg total) by mouth 2 (two) times daily with a meal.   60 tablet   11   . clopidogrel (PLAVIX) 75 MG tablet   Oral   Take 1 tablet (75 mg total) by mouth daily. Pt. Is due for OV and needs to schedule   30 tablet   11   . glipiZIDE (GLUCOTROL) 10 MG tablet   Oral   Take 10 mg by mouth 2 (two) times daily before  a meal.           . hydrochlorothiazide (HYDRODIURIL) 25 MG tablet   Oral   Take 1 tablet (25 mg total) by mouth daily.   30 tablet   11   . lisinopril (PRINIVIL,ZESTRIL) 40 MG tablet   Oral   Take 1 tablet (40 mg total) by mouth daily.   30 tablet   11   . metFORMIN (GLUCOPHAGE) 500 MG tablet   Oral   Take 1,000 mg by mouth 2 (two) times daily with a meal.          . tamsulosin (FLOMAX) 0.4 MG CAPS   Oral   Take 1 capsule (0.4 mg total) by mouth at bedtime.   30 capsule   11     BP 217/102  Pulse 95  Temp(Src) 98.2 F (36.8 C) (Oral)  Resp 22  Ht 5\' 11"  (1.803 m)  Wt 160 lb (72.576 kg)  BMI 22.33 kg/m2  SpO2 98%  Physical Exam  Nursing note and vitals reviewed. Constitutional: He is oriented to person, place, and time. He appears well-developed.  HENT:  Head: Normocephalic.  Eyes: Conjunctivae and EOM are normal. No  scleral icterus.  Neck: Neck supple. No thyromegaly present.  Cardiovascular: Normal rate and regular rhythm.  Exam reveals no gallop and no friction rub.   No murmur heard. Pulmonary/Chest: No stridor. He has no wheezes. He has no rales. He exhibits no tenderness.  Abdominal: He exhibits distension. There is tenderness. There is no rebound.  Distended abdomen with mild tenderness throughout   Musculoskeletal: Normal range of motion. He exhibits no edema.  Lymphadenopathy:    He has no cervical adenopathy.  Neurological: He is oriented to person, place, and time. Coordination normal.  Skin: No rash noted. No erythema.  Psychiatric: He has a normal mood and affect. His behavior is normal.    ED Course  Procedures (including critical care time)  DIAGNOSTIC STUDIES: Oxygen Saturation is 98% on room air, normal by my interpretation.    COORDINATION OF CARE: 1:30 PM Discussed treatment plan with pt at bedside and pt agreed to plan.   Labs Reviewed  CBC WITH DIFFERENTIAL - Abnormal; Notable for the following:    Hemoglobin 12.3 (*)    HCT 35.5 (*)    Neutrophils Relative 85 (*)    All other components within normal limits  COMPREHENSIVE METABOLIC PANEL - Abnormal; Notable for the following:    Potassium 3.4 (*)    Glucose, Bld 179 (*)    GFR calc non Af Amer 54 (*)    GFR calc Af Amer 62 (*)    All other components within normal limits  URINALYSIS, ROUTINE W REFLEX MICROSCOPIC - Abnormal; Notable for the following:    Specific Gravity, Urine >1.030 (*)    Glucose, UA 100 (*)    Hgb urine dipstick MODERATE (*)    Protein, ur >300 (*)    All other components within normal limits  LIPASE, BLOOD  URINE MICROSCOPIC-ADD ON   Dg Chest 2 View  08/23/2012  *RADIOLOGY REPORT*  Clinical Data: Flank pain  CHEST - 2 VIEW  Comparison: Prior chest x-ray 11/01/2008  Findings: Mild hazy right middle lobe opacity may be partially due to to rightward rotation of the patient.  Given the slightly  rotated positioning, the cardiac and mediastinal contours are similar to incrementally enlarged.  Central bronchitic changes are similar to prior.  No effusion or pneumothorax.  No acute osseous abnormality.  IMPRESSION:  1.  Hazy right middle lobe opacity may reflect atelectasis, early asymmetric edema or early infiltrate.  2.  Query new mild cardiomegaly which may be exaggerated by the rightward rotated position of the patient.   Original Report Authenticated By: Malachy Moan, M.D.    Dg Abd 2 Views  08/23/2012  *RADIOLOGY REPORT*  Clinical Data: Abdominal pain, history hypertension, diabetes, throat cancer, coronary artery disease  ABDOMEN - 2 VIEW  Comparison: None  Findings: Gas identified within the normal caliber large and small bowel loops. No bowel dilatation, bowel wall thickening or free intraperitoneal air. No definite evidence of obstruction. Bones unremarkable. Scattered vascular calcifications. Right lower quadrant surgical clips. No acute osseous findings.  IMPRESSION: Nonobstructive bowel gas pattern.   Original Report Authenticated By: Ulyses Southward, M.D.      No diagnosis found.    MDM        The chart was scribed for me under my direct supervision.  I personally performed the history, physical, and medical decision making and all procedures in the evaluation of this patient.[name] Lennert, MD 08/23/12 [phone]

## 2012-08-27 ENCOUNTER — Emergency Department (HOSPITAL_COMMUNITY): Payer: Medicare Other

## 2012-08-27 ENCOUNTER — Other Ambulatory Visit: Payer: Self-pay

## 2012-08-27 ENCOUNTER — Inpatient Hospital Stay (HOSPITAL_COMMUNITY)
Admission: EM | Admit: 2012-08-27 | Discharge: 2012-09-01 | DRG: 189 | Disposition: A | Discharge status: Home / Self Care | Payer: Medicare Other | Attending: Cardiology | Admitting: Cardiology

## 2012-08-27 ENCOUNTER — Inpatient Hospital Stay (HOSPITAL_COMMUNITY): Payer: Medicare Other

## 2012-08-27 ENCOUNTER — Encounter (HOSPITAL_COMMUNITY): Payer: Self-pay | Admitting: *Deleted

## 2012-08-27 DIAGNOSIS — J9600 Acute respiratory failure, unspecified whether with hypoxia or hypercapnia: Principal | ICD-10-CM | POA: Diagnosis present

## 2012-08-27 DIAGNOSIS — I129 Hypertensive chronic kidney disease with stage 1 through stage 4 chronic kidney disease, or unspecified chronic kidney disease: Secondary | ICD-10-CM | POA: Diagnosis present

## 2012-08-27 DIAGNOSIS — Z79899 Other long term (current) drug therapy: Secondary | ICD-10-CM

## 2012-08-27 DIAGNOSIS — N179 Acute kidney failure, unspecified: Secondary | ICD-10-CM | POA: Diagnosis present

## 2012-08-27 DIAGNOSIS — E119 Type 2 diabetes mellitus without complications: Secondary | ICD-10-CM | POA: Diagnosis present

## 2012-08-27 DIAGNOSIS — Z7902 Long term (current) use of antithrombotics/antiplatelets: Secondary | ICD-10-CM

## 2012-08-27 DIAGNOSIS — I10 Essential (primary) hypertension: Secondary | ICD-10-CM | POA: Diagnosis present

## 2012-08-27 DIAGNOSIS — I509 Heart failure, unspecified: Secondary | ICD-10-CM | POA: Diagnosis present

## 2012-08-27 DIAGNOSIS — I739 Peripheral vascular disease, unspecified: Secondary | ICD-10-CM | POA: Diagnosis present

## 2012-08-27 DIAGNOSIS — I2510 Atherosclerotic heart disease of native coronary artery without angina pectoris: Secondary | ICD-10-CM | POA: Diagnosis present

## 2012-08-27 DIAGNOSIS — Z8249 Family history of ischemic heart disease and other diseases of the circulatory system: Secondary | ICD-10-CM

## 2012-08-27 DIAGNOSIS — Z8673 Personal history of transient ischemic attack (TIA), and cerebral infarction without residual deficits: Secondary | ICD-10-CM

## 2012-08-27 DIAGNOSIS — I214 Non-ST elevation (NSTEMI) myocardial infarction: Secondary | ICD-10-CM

## 2012-08-27 DIAGNOSIS — Z923 Personal history of irradiation: Secondary | ICD-10-CM

## 2012-08-27 DIAGNOSIS — Z87891 Personal history of nicotine dependence: Secondary | ICD-10-CM

## 2012-08-27 DIAGNOSIS — I5031 Acute diastolic (congestive) heart failure: Secondary | ICD-10-CM | POA: Diagnosis present

## 2012-08-27 DIAGNOSIS — Z833 Family history of diabetes mellitus: Secondary | ICD-10-CM

## 2012-08-27 DIAGNOSIS — E785 Hyperlipidemia, unspecified: Secondary | ICD-10-CM | POA: Diagnosis present

## 2012-08-27 DIAGNOSIS — Z7982 Long term (current) use of aspirin: Secondary | ICD-10-CM

## 2012-08-27 DIAGNOSIS — Z85819 Personal history of malignant neoplasm of unspecified site of lip, oral cavity, and pharynx: Secondary | ICD-10-CM

## 2012-08-27 DIAGNOSIS — E876 Hypokalemia: Secondary | ICD-10-CM | POA: Diagnosis present

## 2012-08-27 DIAGNOSIS — N183 Chronic kidney disease, stage 3 unspecified: Secondary | ICD-10-CM | POA: Diagnosis present

## 2012-08-27 DIAGNOSIS — J189 Pneumonia, unspecified organism: Secondary | ICD-10-CM | POA: Diagnosis present

## 2012-08-27 HISTORY — DX: Chronic kidney disease, stage 3 unspecified: N18.30

## 2012-08-27 HISTORY — DX: Chronic kidney disease, stage 3 (moderate): N18.3

## 2012-08-27 HISTORY — DX: Chronic diastolic (congestive) heart failure: I50.32

## 2012-08-27 LAB — BASIC METABOLIC PANEL
CO2: 25 mEq/L (ref 19–32)
Calcium: 8.6 mg/dL (ref 8.4–10.5)
Creatinine, Ser: 1.45 mg/dL — ABNORMAL HIGH (ref 0.50–1.35)
GFR calc Af Amer: 52 mL/min — ABNORMAL LOW (ref >90)
GFR calc non Af Amer: 45 mL/min — ABNORMAL LOW (ref >90)
Sodium: 141 mEq/L (ref 135–145)

## 2012-08-27 LAB — HEPATIC FUNCTION PANEL
ALT: 12 U/L (ref 0–53)
AST: 24 U/L (ref 0–37)
Alkaline Phosphatase: 52 U/L (ref 39–117)
Indirect Bilirubin: 0.4 mg/dL (ref 0.3–0.9)
Total Protein: 6.6 g/dL (ref 6.0–8.3)

## 2012-08-27 LAB — URINALYSIS, ROUTINE W REFLEX MICROSCOPIC
Ketones, ur: NEGATIVE mg/dL
Leukocytes, UA: NEGATIVE
Nitrite: NEGATIVE
Protein, ur: NEGATIVE mg/dL
Urobilinogen, UA: 0.2 mg/dL (ref 0.0–1.0)

## 2012-08-27 LAB — CBC WITH DIFFERENTIAL/PLATELET
Basophils Absolute: 0 10*3/uL (ref 0.0–0.1)
Eosinophils Relative: 0 % (ref 0–5)
Lymphocytes Relative: 8 % — ABNORMAL LOW (ref 12–46)
MCV: 80 fL (ref 78.0–100.0)
Neutrophils Relative %: 90 % — ABNORMAL HIGH (ref 43–77)
Platelets: 160 10*3/uL (ref 150–400)
RBC: 4.36 MIL/uL (ref 4.22–5.81)
RDW: 13.8 % (ref 11.5–15.5)
WBC: 9.9 10*3/uL (ref 4.0–10.5)

## 2012-08-27 LAB — TROPONIN I: Troponin I: 1.4 ng/mL (ref <0.30)

## 2012-08-27 LAB — GLUCOSE, CAPILLARY: Glucose-Capillary: 216 mg/dL — ABNORMAL HIGH (ref 70–99)

## 2012-08-27 MED ORDER — ACETAMINOPHEN 650 MG RE SUPP: 650.0000 mg | Freq: Four times a day (QID) | RECTAL | Status: DC | PRN

## 2012-08-27 MED ORDER — BIOTENE DRY MOUTH MT LIQD
15.0000 mL | Freq: Four times a day (QID) | OROMUCOSAL | Status: DC
  Administered 2012-08-27 – 2012-08-30 (×9): 15 mL via OROMUCOSAL

## 2012-08-27 MED ORDER — ATORVASTATIN CALCIUM 40 MG PO TABS
80.0000 mg | ORAL_TABLET | Freq: Every day | ORAL | Status: DC
  Administered 2012-08-27 – 2012-08-31 (×5): 80 mg via ORAL
  Filled 2012-08-27: qty 1
  Filled 2012-08-27 (×3): qty 2
  Filled 2012-08-27: qty 1
  Filled 2012-08-27 (×4): qty 2

## 2012-08-27 MED ORDER — PANTOPRAZOLE SODIUM 20 MG PO TBEC
20.0000 mg | DELAYED_RELEASE_TABLET | Freq: Every day | ORAL | Status: DC
  Filled 2012-08-27 (×4): qty 1

## 2012-08-27 MED ORDER — CLOPIDOGREL BISULFATE 75 MG PO TABS
75.0000 mg | ORAL_TABLET | Freq: Every day | ORAL | Status: DC
  Administered 2012-08-27 – 2012-09-01 (×6): 75 mg via ORAL
  Filled 2012-08-27 (×7): qty 1

## 2012-08-27 MED ORDER — NITROGLYCERIN IN D5W 200-5 MCG/ML-% IV SOLN
15.0000 ug/min | INTRAVENOUS | Status: DC
  Administered 2012-08-27: 15 ug/min via INTRAVENOUS
  Filled 2012-08-27: qty 250

## 2012-08-27 MED ORDER — SODIUM CHLORIDE 0.9 % IJ SOLN
3.0000 mL | Freq: Two times a day (BID) | INTRAMUSCULAR | Status: DC
  Administered 2012-08-27 – 2012-08-30 (×7): 3 mL via INTRAVENOUS

## 2012-08-27 MED ORDER — DEXTROSE 5 % IV SOLN
500.0000 mg | INTRAVENOUS | Status: DC
  Filled 2012-08-27 (×3): qty 500

## 2012-08-27 MED ORDER — INSULIN ASPART 100 UNIT/ML ~~LOC~~ SOLN
0.0000 [IU] | Freq: Three times a day (TID) | SUBCUTANEOUS | Status: DC
  Administered 2012-08-28 – 2012-08-29 (×3): 3 [IU] via SUBCUTANEOUS
  Administered 2012-08-29: 2 [IU] via SUBCUTANEOUS
  Administered 2012-08-30 (×2): 3 [IU] via SUBCUTANEOUS

## 2012-08-27 MED ORDER — SODIUM CHLORIDE 0.9 % IV SOLN: 250.0000 mL | INTRAVENOUS | Status: DC | PRN

## 2012-08-27 MED ORDER — HEPARIN SODIUM (PORCINE) 5000 UNIT/ML IJ SOLN: 5000.0000 [IU] | Freq: Three times a day (TID) | INTRAMUSCULAR | Status: DC

## 2012-08-27 MED ORDER — PANTOPRAZOLE SODIUM 40 MG IV SOLR
40.0000 mg | Freq: Every day | INTRAVENOUS | Status: DC
  Administered 2012-08-27: 40 mg via INTRAVENOUS
  Filled 2012-08-27: qty 40

## 2012-08-27 MED ORDER — ASPIRIN 325 MG PO TABS: 325.0000 mg | ORAL_TABLET | Freq: Every day | ORAL | Status: DC

## 2012-08-27 MED ORDER — MORPHINE SULFATE 2 MG/ML IJ SOLN: 1.0000 mg | INTRAMUSCULAR | Status: DC | PRN

## 2012-08-27 MED ORDER — NITROGLYCERIN 0.4 MG SL SUBL: 0.4000 mg | SUBLINGUAL_TABLET | SUBLINGUAL | Status: DC | PRN

## 2012-08-27 MED ORDER — HYDRALAZINE HCL 20 MG/ML IJ SOLN
10.0000 mg | INTRAMUSCULAR | Status: DC | PRN
  Administered 2012-08-27 – 2012-08-31 (×3): 10 mg via INTRAVENOUS
  Filled 2012-08-27 (×3): qty 1

## 2012-08-27 MED ORDER — ASPIRIN EC 81 MG PO TBEC
81.0000 mg | DELAYED_RELEASE_TABLET | Freq: Every day | ORAL | Status: DC
  Administered 2012-08-27: 81 mg via ORAL
  Filled 2012-08-27 (×4): qty 1

## 2012-08-27 MED ORDER — HEPARIN SODIUM (PORCINE) 5000 UNIT/ML IJ SOLN
5000.0000 [IU] | Freq: Three times a day (TID) | INTRAMUSCULAR | Status: DC
  Administered 2012-08-27: 5000 [IU] via SUBCUTANEOUS
  Filled 2012-08-27: qty 1

## 2012-08-27 MED ORDER — CARVEDILOL 3.125 MG PO TABS: 6.2500 mg | ORAL_TABLET | Freq: Two times a day (BID) | ORAL | Status: DC

## 2012-08-27 MED ORDER — DEXTROSE 5 % IV SOLN
1.0000 g | INTRAVENOUS | Status: DC
  Filled 2012-08-27 (×3): qty 10

## 2012-08-27 MED ORDER — ACETAMINOPHEN 325 MG PO TABS: 650.0000 mg | ORAL_TABLET | Freq: Four times a day (QID) | ORAL | Status: DC | PRN

## 2012-08-27 MED ORDER — HYDROCODONE-ACETAMINOPHEN 5-325 MG PO TABS: 1.0000 | ORAL_TABLET | ORAL | Status: DC | PRN

## 2012-08-27 MED ORDER — SODIUM CHLORIDE 0.9 % IJ SOLN: 3.0000 mL | INTRAMUSCULAR | Status: DC | PRN

## 2012-08-27 MED ORDER — SODIUM CHLORIDE 0.9 % IV SOLN
Freq: Once | INTRAVENOUS | Status: AC
  Administered 2012-08-27: 14:00:00 via INTRAVENOUS

## 2012-08-27 MED ORDER — TAMSULOSIN HCL 0.4 MG PO CAPS
0.4000 mg | ORAL_CAPSULE | Freq: Every day | ORAL | Status: DC
  Administered 2012-08-27 – 2012-08-31 (×5): 0.4 mg via ORAL
  Filled 2012-08-27 (×8): qty 1

## 2012-08-27 MED ORDER — CEFTRIAXONE SODIUM 1 G IJ SOLR
1.0000 g | Freq: Once | INTRAMUSCULAR | Status: AC
  Administered 2012-08-27: 1 g via INTRAVENOUS
  Filled 2012-08-27: qty 10

## 2012-08-27 MED ORDER — ASPIRIN 325 MG PO TABS
325.0000 mg | ORAL_TABLET | Freq: Every day | ORAL | Status: DC
  Administered 2012-08-28: 325 mg via ORAL
  Filled 2012-08-27: qty 1

## 2012-08-27 MED ORDER — CLOPIDOGREL BISULFATE 75 MG PO TABS: 75.0000 mg | ORAL_TABLET | Freq: Every day | ORAL | Status: DC

## 2012-08-27 MED ORDER — ALBUTEROL SULFATE (5 MG/ML) 0.5% IN NEBU
2.5000 mg | INHALATION_SOLUTION | Freq: Once | RESPIRATORY_TRACT | Status: AC
  Administered 2012-08-27: 2.5 mg via RESPIRATORY_TRACT
  Filled 2012-08-27: qty 0.5

## 2012-08-27 MED ORDER — DEXTROSE 5 % IV SOLN
500.0000 mg | Freq: Once | INTRAVENOUS | Status: AC
  Administered 2012-08-27: 500 mg via INTRAVENOUS
  Filled 2012-08-27: qty 500

## 2012-08-27 MED ORDER — FUROSEMIDE 10 MG/ML IJ SOLN
80.0000 mg | Freq: Once | INTRAMUSCULAR | Status: AC
  Administered 2012-08-27: 80 mg via INTRAVENOUS
  Filled 2012-08-27: qty 8

## 2012-08-27 NOTE — ED Notes (Signed)
Pt's emergency contact, [name] notified of pt's deteriorating condition and possible transfer to [facility]. Emergency contact stated, so you are going to keep him right?Marland Kitchen. Oh ok ...well I'm going to work

## 2012-08-27 NOTE — Progress Notes (Signed)
ANTIBIOTIC CONSULT NOTE - INITIAL  Pharmacy Consult for Rocephin Indication: rule out pneumonia  No Known Allergies  Patient Measurements: Height: 5\' 11"  (180.3 cm) Weight: 167 lb (75.751 kg) IBW/kg (Calculated) : 75.3  Vital Signs: Temp: 98.6 F (37 C) (05/09 1722) Temp src: Oral (05/09 1722) BP: 173/92 mmHg (05/09 1722) Pulse Rate: 81 (05/09 1547) Intake/Output from previous day:   Intake/Output from this shift:    Labs:  Recent Labs  08/27/12 1220  WBC 9.9  HGB 12.0*  PLT 160  CREATININE 1.45*   Estimated Creatinine Clearance: 45.4 ml/min (by C-G formula based on Cr of 1.45). No results found for this basename: VANCOTROUGH, VANCOPEAK, VANCORANDOM, GENTTROUGH, GENTPEAK, GENTRANDOM, TOBRATROUGH, TOBRAPEAK, TOBRARND, AMIKACINPEAK, AMIKACINTROU, AMIKACIN,  in the last 72 hours   Microbiology: No results found for this or any previous visit (from the past 720 hour(s)).  Medical History: Past Medical History  Diagnosis Date  . Hypertension   . Diabetes mellitus without complication     Type 2  . Hyperlipidemia   . CAD (coronary artery disease)   . PAD (peripheral artery disease)   . Throat cancer     Medications:  Scheduled:  . [COMPLETED] sodium chloride   Intravenous Once  . [COMPLETED] albuterol  2.5 mg Nebulization Once  . aspirin EC  81 mg Oral Daily  . atorvastatin  80 mg Oral q1800  . [COMPLETED] azithromycin (ZITHROMAX) 500 MG IVPB  500 mg Intravenous Once  . [START ON 08/28/2012] azithromycin  500 mg Intravenous Q24H  . [COMPLETED] cefTRIAXone (ROCEPHIN) IVPB 1 gram/50 mL D5W  1 g Intravenous Once  . [START ON 08/28/2012] cefTRIAXone (ROCEPHIN)  IV  1 g Intravenous Q24H  . clopidogrel  75 mg Oral Q breakfast  . [COMPLETED] furosemide  80 mg Intravenous Once  . heparin  5,000 Units Subcutaneous Q8H  . [START ON 08/28/2012] insulin aspart  0-15 Units Subcutaneous TID WC  . pantoprazole  20 mg Oral Daily  . sodium chloride  3 mL Intravenous Q12H  .  tamsulosin  0.4 mg Oral QHS  . [DISCONTINUED] clopidogrel  75 mg Oral Daily  . [DISCONTINUED] heparin  5,000 Units Subcutaneous Q8H   Assessment: Okay for Protocol Estimated Creatinine Clearance: 45.4 ml/min (by C-G formula based on Cr of 1.45). Patient received initial doses of Rocephin and Zithromax in ED.  Goal of Therapy:  Eradicate infection.  Plan:  Follow up culture results Rocephin 1gm IV every 24 hours.  Mady Gemma 08/27/2012,6:08 PM

## 2012-08-27 NOTE — H&P (Signed)
[facility] History and Physical  [name] ZOX:[id number] DOB: August 06, 1933 DOA: 08/27/2012  Referring physician:  PCP: Harlow Asa, MD  Specialists:   Chief Complaint:  sob  HPI: [name] is a 79 y.o. male with past medical hx of CAD, HTN, DM, CVA , throat cancer presext to ED cc sob. Information obtained from pt. Of note pt difficult to understand due to bipap and very poor dentition. States he developed sob yesterday with productive cough. Denies CP, palpitation, fever, chills, nausea, vomiting, diaphoresis. Denies anorexia, dysuria, hematuria, melena. Denies increased LE edema but reports orthopnea requiring 3 pillows to sleep. Pt states that activity made symptoms worse and resting made better. Symptoms came on suddenly, persisted and worsened. Characterized as severe. Work up in ED yields chest xray with worsening airspace disease right middle lobe suspicious for worsening pneumonia, creatinine 1.45 troponin 1.4 and ProBNP 6185. In addition,  pt HTN, tachypnea with sats 86. Pt placed on BIPAP and NTG drip. On exam pt breathing comfortably on BiPap,  BP controlled. TRH asked to admit.     Review of Systems: The patient denies anorexia, fever, weight loss,, vision loss, decreased hearing, hoarseness, , syncope, dyspnea on exertion,  balance deficits, hemoptysis, abdominal pain, melena, hematochezia, severe indigestion/heartburn, hematuria, incontinence, genital sores, muscle weakness, suspicious skin lesions, transient blindness, difficulty walking, depression, unusual weight change, abnormal bleeding, enlarged lymph nodes, angioedema, and breast masses.    Past Medical History  Diagnosis Date  . Hypertension   . Diabetes mellitus without complication     Type 2  . Hyperlipidemia   . CAD (coronary artery disease)   . PAD (peripheral artery disease)   . Throat cancer    Past Surgical History  Procedure Laterality Date  . Throat surgery     Social History:   reports that he has quit smoking. He does not have any smokeless tobacco history on file. He reports that he does not drink alcohol or use illicit drugs. Lives alone. A widower independent ADL's  No Known Allergies  Family History  Problem Relation Age of Onset  . Diabetes Mother   . Heart attack Brother     Prior to Admission medications   Medication Sig Start Date End Date Taking? Authorizing Provider  amLODipine (NORVASC) 10 MG tablet Take 1 tablet (10 mg total) by mouth daily. 09/08/11  Yes Jodelle Gross, NP  aspirin 81 MG EC tablet Take 81 mg by mouth daily.     Yes Historical Provider, MD  atorvastatin (LIPITOR) 80 MG tablet Take 1 tablet (80 mg total) by mouth daily. 09/08/11  Yes Jodelle Gross, NP  carvedilol (COREG) 25 MG tablet Take 1 tablet (25 mg total) by mouth 2 (two) times daily with a meal. 09/08/11  Yes Jodelle Gross, NP  clopidogrel (PLAVIX) 75 MG tablet Take 1 tablet (75 mg total) by mouth daily. Pt. Is due for OV and needs to schedule 09/08/11  Yes Jodelle Gross, NP  glipiZIDE (GLUCOTROL) 10 MG tablet Take 10 mg by mouth 2 (two) times daily before a meal.     Yes Historical Provider, MD  hydrochlorothiazide (HYDRODIURIL) 25 MG tablet Take 1 tablet (25 mg total) by mouth daily. 09/08/11  Yes Jodelle Gross, NP  lisinopril (PRINIVIL,ZESTRIL) 40 MG tablet Take 1 tablet (40 mg total) by mouth daily. 09/08/11  Yes Jodelle Gross, NP  metFORMIN (GLUCOPHAGE) 500 MG tablet Take 1,000 mg by mouth 2 (two) times daily with a meal.  Yes Historical Provider, MD  pantoprazole (PROTONIX) 20 MG tablet Take 1 tablet (20 mg total) by mouth daily. 08/23/12  Yes Benny Lennert, MD  tamsulosin (FLOMAX) 0.4 MG CAPS Take 1 capsule (0.4 mg total) by mouth at bedtime. 07/13/12  Yes Merlyn Albert, MD   Physical Exam: Filed Vitals:   08/27/12 1415 08/27/12 1425 08/27/12 1428 08/27/12 1500  BP: 165/95  171/98 149/89  Pulse: 117  112 94  Temp:      TempSrc:      Resp:  36  35 35  Height:      Weight:      SpO2: 100% 100% 100% 100%     General:  Awake alert well nourished  Eyes: PERRL EOMI   ENT: ears clear, nose without drainage, mucus membrane mouth moist pink  Neck: supple no lymphadenopathy full rom  Cardiovascular: RRR . No gallop rhythm. No murmurs. Jugular venous pressure not raised. There is bilateral pitting leg edema.  Respiratory: mild increased work of breathing on bipap. BS somewhat distant in bases, fine crackles in rt greater than left base. No wheeze. No bronchial breathing.  Abdomen: round soft +BS non-tender to palpation  Skin: warm dry no rash no lesion  Musculoskeletal: no clubbing no cyanosis  Psychiatric: calm cooperative  Neurologic: facial symmetry, cranial nerve II-XII intacte  Labs on Admission:  Basic Metabolic Panel:  Recent Labs Lab 08/23/12 1242 08/27/12 1220  NA 143 141  K 3.4* 3.5  CL 104 102  CO2 31 25  GLUCOSE 179* 187*  BUN 21 28*  CREATININE 1.25 1.45*  CALCIUM 9.0 8.6   Liver Function Tests:  Recent Labs Lab 08/23/12 1242  AST 16  ALT 9  ALKPHOS 52  BILITOT 0.7  PROT 6.7  ALBUMIN 3.5    Recent Labs Lab 08/23/12 1242  LIPASE 22    CBC:  Recent Labs Lab 08/23/12 1242 08/27/12 1220  WBC 7.5 9.9  NEUTROABS 6.4 8.9*  HGB 12.3* 12.0*  HCT 35.5* 34.9*  MCV 80.1 80.0  PLT 164 160   Cardiac Enzymes:  Recent Labs Lab 08/27/12 1220  TROPONINI 1.40*       Radiological Exams on Admission: Dg Chest 2 View  08/27/2012  *RADIOLOGY REPORT*  Clinical Data: Shortness of breath  CHEST - 2 VIEW  Comparison: 08/23/2012  Findings: Cardiomediastinal silhouette is stable.  Central bronchitic changes.  Worsening streaky airspace disease in the right middle lobe suspicious for worsening pneumonia.  Probable small right pleural effusion.  IMPRESSION:  Central bronchitic changes.  Worsening airspace disease right middle lobe suspicious for worsening pneumonia.   Original Report  Authenticated By: Natasha Mead, M.D.     EKG: Independently reviewed. Sinus tach with PAC's  Assessment/Plan Principal Problem:   Acute respiratory failure: etiology uncertain likely related to acute heart failure. Will admit to SD. Will wean bipap. Will start lasix. Will monitor closely Active Problems: Acute heart failure: may be related to poorly controlled HTN. Will start lasix, monitor intake and output, daily weight, HH diet. Will repeat BNP in am. Check echo.     Acute renal failure: related to #1 and #2. Creatinine may go up with lasix. Will monitor. Strict intake and output    Community acquired pneumonia: afebrile, no elevation of white count. Suspect xray more consistent with #2. However, pt with persistent cough over last week. Will continue rocephin and azithromycin. Will repeat chest xray in am. If cleared will consider discontinuing antibiotic.     DIABETES MELLITUS,  TYPE II: will check HgA1c. Hold home regimen. Not on insulin. Will use SSI for glycemic control    HYPERLIPIDEMIA: check lipid panel    HYPERTENSION: on presentation pt SBP 265 and DBP 143. NTG started. On admission, BP 149/85. Will continue NTG drip. Monitor on SD.     ATHEROSCLEROTIC CARDIOVASCULAR DISEASE       Code Status: full Family Communication:  Disposition Plan: home when ready  Time spent: 70 minutes  [facility]   If 7PM-7AM, please contact night-coverage [email or web address] Password [facility] 08/27/2012, 3:32 PM Attending: Patient seen and examined in above note reviewed and modified. I think that the main pathology here is acute congestive heart failure, with a degree of lung infection. Plan: 1. Intravenous diuresis. 2. Echocardiogram. 3. Serial cardiac enzymes. 4. Cardiology consultation. 4. I would still continue with antibiotics for superimposed lung infection.

## 2012-08-27 NOTE — ED Notes (Signed)
Pt requiring to sit upright to compensate for worsening SOB. SAO2 97 at this time on 2 LPM [place]. EDP aware. Pt continues to deny N/V, chest pain and pain in general.

## 2012-08-27 NOTE — ED Notes (Signed)
Sob since last night , alert, denies pain

## 2012-08-27 NOTE — ED Notes (Signed)
K. Black NP at bedside discussing plan of care with pt.

## 2012-08-27 NOTE — ED Notes (Signed)
RTT paged to bedside secondary to worsening SOB, desat, and pt has become diaphoretic. EDP aware.

## 2012-08-27 NOTE — ED Notes (Signed)
CRITICAL VALUE ALERT  Critical value received:  Troponin 1.40  Date of notification:  5//12/2012  Time of notification:  1304  Critical value read back:yes  Nurse who received alert:  Tarri Glenn RN  MD notified (1st page):  Dr Adriana Simas  Time of first page:  1304  MD notified (2nd page):  Time of second page:  Responding MD:  Dr Adriana Simas  Time MD responded: [phone]

## 2012-08-27 NOTE — ED Notes (Signed)
Pt 's hypertensive state, diaphoresis, and worsening SOB brought to EDP's attention. New orders received.

## 2012-08-27 NOTE — ED Provider Notes (Signed)
History     This chart was scribed for Donnetta Hutching, MD, MD by Smitty Pluck, ED Scribe. The patient was seen in room APA15/APA15 and the patient's care was started at 11:55 AM.   CSN: [id number]  Arrival date & time 08/27/12  1051      Chief Complaint  Patient presents with  . Shortness of Breath    The history is provided by the patient and medical records. No language interpreter was used.   HPI Comments: [name] is a 79 y.o. male with hx of HTN, DM, throat CA, CAD and PAD who presents to the Emergency Department BIB grandson complaining of constant, moderate SOB onset 1 day ago. He reports hx of similar symptoms. He reports having cough onset 2 weeks ago that has remained constant. Pt denies fever, chills, nausea, vomiting, diarrhea, weakness and any other pain. Pt denies hx of MI.    Past Medical History  Diagnosis Date  . Hypertension   . Diabetes mellitus without complication     Type 2  . Hyperlipidemia   . CAD (coronary artery disease)   . PAD (peripheral artery disease)   . Throat cancer     Past Surgical History  Procedure Laterality Date  . Throat surgery      Family History  Problem Relation Age of Onset  . Diabetes Mother   . Heart attack Brother     History  Substance Use Topics  . Smoking status: Former Games developer  . Smokeless tobacco: Not on file  . Alcohol Use: No      Review of Systems  Constitutional: Negative for fever and chills.  Respiratory: Positive for cough and shortness of breath.   All other systems reviewed and are negative.    Allergies  Review of patient's allergies indicates no known allergies.  Home Medications   Current Outpatient Rx  Name  Route  Sig  Dispense  Refill  . amLODipine (NORVASC) 10 MG tablet   Oral   Take 1 tablet (10 mg total) by mouth daily.   30 tablet   11   . aspirin 81 MG EC tablet   Oral   Take 81 mg by mouth daily.           Marland Kitchen atorvastatin (LIPITOR) 80 MG tablet   Oral   Take 1  tablet (80 mg total) by mouth daily.   30 tablet   11   . carvedilol (COREG) 25 MG tablet   Oral   Take 1 tablet (25 mg total) by mouth 2 (two) times daily with a meal.   60 tablet   11   . clopidogrel (PLAVIX) 75 MG tablet   Oral   Take 1 tablet (75 mg total) by mouth daily. Pt. Is due for OV and needs to schedule   30 tablet   11   . glipiZIDE (GLUCOTROL) 10 MG tablet   Oral   Take 10 mg by mouth 2 (two) times daily before a meal.           . hydrochlorothiazide (HYDRODIURIL) 25 MG tablet   Oral   Take 1 tablet (25 mg total) by mouth daily.   30 tablet   11   . lisinopril (PRINIVIL,ZESTRIL) 40 MG tablet   Oral   Take 1 tablet (40 mg total) by mouth daily.   30 tablet   11   . metFORMIN (GLUCOPHAGE) 500 MG tablet   Oral   Take 1,000 mg by mouth 2 (two) times  daily with a meal.          . pantoprazole (PROTONIX) 20 MG tablet   Oral   Take 1 tablet (20 mg total) by mouth daily.   30 tablet   0   . tamsulosin (FLOMAX) 0.4 MG CAPS   Oral   Take 1 capsule (0.4 mg total) by mouth at bedtime.   30 capsule   11     BP 188/90  Pulse 103  Temp(Src) 97.6 F (36.4 C) (Oral)  Resp 36  Ht 5\' 11"  (1.803 m)  Wt 167 lb (75.751 kg)  BMI 23.3 kg/m2  SpO2 92%  Physical Exam  Nursing note and vitals reviewed. Constitutional: He is oriented to person, place, and time. He appears well-developed and well-nourished. No distress.  HENT:  Head: Normocephalic and atraumatic.  Eyes: Conjunctivae and EOM are normal. Pupils are equal, round, and reactive to light.  Neck: Normal range of motion. Neck supple.  Cardiovascular: Normal rate, regular rhythm and normal heart sounds.   Pulmonary/Chest: Breath sounds normal. Tachypnea noted.  Abdominal: Soft. Bowel sounds are normal.  Musculoskeletal: Normal range of motion. He exhibits no edema.  Neurological: He is alert and oriented to person, place, and time.  Skin: Skin is warm and dry.  Psychiatric: He has a normal mood  and affect.    ED Course  Procedures (including critical care time) DIAGNOSTIC STUDIES: Oxygen Saturation is 92% on room air, low by my interpretation.    COORDINATION OF CARE: 11:58 AM Discussed ED treatment with pt and pt agrees.     Labs Reviewed - No data to display No results found.   No diagnosis found.  Date: 08/27/2012    11:49  Rate: 100  Rhythm: normal sinus rhythm  QRS Axis: normal  Intervals: normal  ST/T Wave abnormalities: normal  Conduction Disutrbances:none  Narrative Interpretation:   Old EKG Reviewed: changes noted   Date: 08/27/2012    13:18  Rate: 107  Rhythm: sinus tachycardia  QRS Axis: normal  Intervals: normal  ST/T Wave abnormalities: normal  Conduction Disutrbances:none  Narrative Interpretation:   Old EKG Reviewed: changes noted PAC's     Results for orders placed during the hospital encounter of 08/27/12  BASIC METABOLIC PANEL      Result Value Range   Sodium 141  135 - 145 mEq/L   Potassium 3.5  3.5 - 5.1 mEq/L   Chloride 102  96 - 112 mEq/L   CO2 25  19 - 32 mEq/L   Glucose, Bld 187 (*) 70 - 99 mg/dL   BUN 28 (*) 6 - 23 mg/dL   Creatinine, Ser 4.09 (*) 0.50 - 1.35 mg/dL   Calcium 8.6  8.4 - 81.1 mg/dL   GFR calc non Af Amer 45 (*) >90 mL/min   GFR calc Af Amer 52 (*) >90 mL/min  CBC WITH DIFFERENTIAL      Result Value Range   WBC 9.9  4.0 - 10.5 K/uL   RBC 4.36  4.22 - 5.81 MIL/uL   Hemoglobin 12.0 (*) 13.0 - 17.0 g/dL   HCT 91.4 (*) 78.2 - 95.6 %   MCV 80.0  78.0 - 100.0 fL   MCH 27.5  26.0 - 34.0 pg   MCHC 34.4  30.0 - 36.0 g/dL   RDW 21.3  08.6 - 57.8 %   Platelets 160  150 - 400 K/uL   Neutrophils Relative 90 (*) 43 - 77 %   Neutro Abs 8.9 (*) 1.7 - 7.7  K/uL   Lymphocytes Relative 8 (*) 12 - 46 %   Lymphs Abs 0.8  0.7 - 4.0 K/uL   Monocytes Relative 2 (*) 3 - 12 %   Monocytes Absolute 0.2  0.1 - 1.0 K/uL   Eosinophils Relative 0  0 - 5 %   Eosinophils Absolute 0.0  0.0 - 0.7 K/uL   Basophils Relative 0  0 - 1  %   Basophils Absolute 0.0  0.0 - 0.1 K/uL  TROPONIN I      Result Value Range   Troponin I 1.40 (*) <0.30 ng/mL  PRO B NATRIURETIC PEPTIDE      Result Value Range   Pro B Natriuretic peptide (BNP) 6185.0 (*) 0 - 450 pg/mL      Dg Chest 2 View  08/27/2012  *RADIOLOGY REPORT*  Clinical Data: Shortness of breath  CHEST - 2 VIEW  Comparison: 08/23/2012  Findings: Cardiomediastinal silhouette is stable.  Central bronchitic changes.  Worsening streaky airspace disease in the right middle lobe suspicious for worsening pneumonia.  Probable small right pleural effusion.  IMPRESSION:  Central bronchitic changes.  Worsening airspace disease right middle lobe suspicious for worsening pneumonia.   Original Report Authenticated By: Natasha Mead, M.D.      CRITICAL CARE Performed by: Donnetta Hutching Total critical care time: 40 Critical care time was exclusive of separately billable procedures and treating other patients. Critical care was necessary to treat or prevent imminent or life-threatening deterioration. Critical care was time spent personally by me on the following activities: development of treatment plan with patient and/or surrogate as well as nursing, discussions with consultants, evaluation of patient's response to treatment, examination of patient, obtaining history from patient or surrogate, ordering and performing treatments and interventions, ordering and review of laboratory studies, ordering and review of radiographic studies, pulse oximetry and re-evaluation of patient's condition. MDM  Patient's dyspnea and tachypnea increased during his ED course.   Chest x-ray reveals a right middle lobe pneumonia.  Blood pressure also elevated.   BiPAP initiated along with a nitroglycerin drip.   Patient responded well to interventions with a decreases in respiratory rate and improving blood pressure.   Discussed with [place] critical care. They deferred to the hospitalist at [facility].      I  personally performed the services described in this documentation, which was scribed in my presence. The recorded information has been reviewed and is accurate.    Donnetta Hutching, MD 08/27/12 1544

## 2012-08-27 NOTE — Progress Notes (Addendum)
Pt lying comfortably on 3lpm-- sat 99-100, resp f27 to 34, pt dyspnea with exertion. BiPAP held for now will return if needed.

## 2012-08-27 NOTE — ED Notes (Signed)
Pt placed on Bipap per RTT.  Pt tolerated well.

## 2012-08-28 ENCOUNTER — Other Ambulatory Visit: Payer: Self-pay

## 2012-08-28 ENCOUNTER — Inpatient Hospital Stay (HOSPITAL_COMMUNITY): Payer: Medicare Other

## 2012-08-28 DIAGNOSIS — I509 Heart failure, unspecified: Secondary | ICD-10-CM

## 2012-08-28 DIAGNOSIS — J9600 Acute respiratory failure, unspecified whether with hypoxia or hypercapnia: Principal | ICD-10-CM

## 2012-08-28 DIAGNOSIS — N179 Acute kidney failure, unspecified: Secondary | ICD-10-CM

## 2012-08-28 DIAGNOSIS — I214 Non-ST elevation (NSTEMI) myocardial infarction: Secondary | ICD-10-CM

## 2012-08-28 LAB — HEPARIN LEVEL (UNFRACTIONATED)
Heparin Unfractionated: 0.6 IU/mL (ref 0.30–0.70)
Heparin Unfractionated: 0.52 IU/mL (ref 0.30–0.70)

## 2012-08-28 LAB — BASIC METABOLIC PANEL
Calcium: 8.4 mg/dL (ref 8.4–10.5)
Creatinine, Ser: 1.62 mg/dL — ABNORMAL HIGH (ref 0.50–1.35)
GFR calc non Af Amer: 39 mL/min — ABNORMAL LOW (ref >90)
Sodium: 141 mEq/L (ref 135–145)

## 2012-08-28 LAB — CBC
MCH: 27.5 pg (ref 26.0–34.0)
MCH: 27.8 pg (ref 26.0–34.0)
MCHC: 34.9 g/dL (ref 30.0–36.0)
MCV: 78.3 fL (ref 78.0–100.0)
MCV: 79.4 fL (ref 78.0–100.0)
Platelets: 151 10*3/uL (ref 150–400)
Platelets: 156 10*3/uL (ref 150–400)
RBC: 3.96 MIL/uL — ABNORMAL LOW (ref 4.22–5.81)
RDW: 13.7 % (ref 11.5–15.5)
RDW: 13.8 % (ref 11.5–15.5)
WBC: 8.6 10*3/uL (ref 4.0–10.5)

## 2012-08-28 LAB — GLUCOSE, CAPILLARY
Glucose-Capillary: 83 mg/dL (ref 70–99)
Glucose-Capillary: 188 mg/dL — ABNORMAL HIGH (ref 70–99)
Glucose-Capillary: 132 mg/dL — ABNORMAL HIGH (ref 70–99)

## 2012-08-28 LAB — PROTIME-INR: Prothrombin Time: 14.2 seconds (ref 11.6–15.2)

## 2012-08-28 LAB — TROPONIN I
Troponin I: 2.45 ng/mL (ref <0.30)
Troponin I: 2.38 ng/mL (ref <0.30)

## 2012-08-28 LAB — PRO B NATRIURETIC PEPTIDE: Pro B Natriuretic peptide (BNP): 12281 pg/mL — ABNORMAL HIGH (ref 0–450)

## 2012-08-28 MED ORDER — NITROGLYCERIN 2 % TD OINT
0.5000 [in_us] | TOPICAL_OINTMENT | Freq: Four times a day (QID) | TRANSDERMAL | Status: DC
  Administered 2012-08-28 – 2012-09-01 (×15): 0.5 [in_us] via TOPICAL
  Filled 2012-08-28: qty 30
  Filled 2012-08-28: qty 1

## 2012-08-28 MED ORDER — LEVOFLOXACIN 500 MG PO TABS
500.0000 mg | ORAL_TABLET | Freq: Every day | ORAL | Status: DC
  Administered 2012-08-28 – 2012-08-29 (×2): 500 mg via ORAL
  Filled 2012-08-28 (×3): qty 1

## 2012-08-28 MED ORDER — ASPIRIN EC 81 MG PO TBEC
81.0000 mg | DELAYED_RELEASE_TABLET | Freq: Every day | ORAL | Status: DC
  Administered 2012-08-28 – 2012-09-01 (×5): 81 mg via ORAL
  Filled 2012-08-28 (×5): qty 1

## 2012-08-28 MED ORDER — PANTOPRAZOLE SODIUM 40 MG PO TBEC
40.0000 mg | DELAYED_RELEASE_TABLET | Freq: Every day | ORAL | Status: DC
  Administered 2012-08-28 – 2012-09-01 (×5): 40 mg via ORAL
  Filled 2012-08-28 (×4): qty 1
  Filled 2012-08-28: qty 71

## 2012-08-28 MED ORDER — AMLODIPINE BESYLATE 10 MG PO TABS
10.0000 mg | ORAL_TABLET | Freq: Every day | ORAL | Status: DC
  Administered 2012-08-28 – 2012-09-01 (×5): 10 mg via ORAL
  Filled 2012-08-28: qty 2
  Filled 2012-08-28 (×4): qty 1

## 2012-08-28 MED ORDER — POTASSIUM CHLORIDE CRYS ER 20 MEQ PO TBCR
40.0000 meq | EXTENDED_RELEASE_TABLET | Freq: Two times a day (BID) | ORAL | Status: DC
  Administered 2012-08-28 (×2): 40 meq via ORAL
  Filled 2012-08-28 (×4): qty 2

## 2012-08-28 MED ORDER — FUROSEMIDE 10 MG/ML IJ SOLN
40.0000 mg | Freq: Two times a day (BID) | INTRAMUSCULAR | Status: DC
  Administered 2012-08-28 – 2012-08-29 (×3): 40 mg via INTRAVENOUS
  Filled 2012-08-28 (×5): qty 4

## 2012-08-28 MED ORDER — HEPARIN (PORCINE) IN NACL 100-0.45 UNIT/ML-% IJ SOLN
900.0000 [IU]/h | INTRAMUSCULAR | Status: DC
  Administered 2012-08-28 – 2012-08-30 (×4): 1050 [IU]/h via INTRAVENOUS
  Filled 2012-08-28 (×5): qty 250

## 2012-08-28 MED ORDER — INSULIN DETEMIR 100 UNIT/ML ~~LOC~~ SOLN
10.0000 [IU] | Freq: Every day | SUBCUTANEOUS | Status: DC
  Administered 2012-08-28 – 2012-08-31 (×4): 10 [IU] via SUBCUTANEOUS
  Filled 2012-08-28 (×9): qty 0.1

## 2012-08-28 MED ORDER — POTASSIUM CHLORIDE CRYS ER 20 MEQ PO TBCR
40.0000 meq | EXTENDED_RELEASE_TABLET | Freq: Every day | ORAL | Status: DC
  Administered 2012-08-28: 40 meq via ORAL
  Filled 2012-08-28: qty 2

## 2012-08-28 MED ORDER — HEPARIN BOLUS VIA INFUSION
2000.0000 [IU] | Freq: Once | INTRAVENOUS | Status: AC
  Administered 2012-08-28: 2000 [IU] via INTRAVENOUS
  Filled 2012-08-28: qty 2000

## 2012-08-28 MED ORDER — CARVEDILOL 12.5 MG PO TABS
12.5000 mg | ORAL_TABLET | Freq: Two times a day (BID) | ORAL | Status: DC
  Administered 2012-08-28 – 2012-08-29 (×4): 12.5 mg via ORAL
  Filled 2012-08-28 (×7): qty 1

## 2012-08-28 MED ORDER — FUROSEMIDE 10 MG/ML IJ SOLN
80.0000 mg | Freq: Once | INTRAMUSCULAR | Status: AC
  Administered 2012-08-28: 80 mg via INTRAVENOUS
  Filled 2012-08-28: qty 8

## 2012-08-28 NOTE — Progress Notes (Signed)
Pt admitted from [facility] a/o , VSS , chest clear , moves all extremities  , MD made aware of ad;mission ., no skin issues MRSA PCR done , g-bath done

## 2012-08-28 NOTE — Progress Notes (Signed)
Pt transferred to [facility] via [facility]. Report given to [facility] and to 2600 RN, [place]. Vital signs stable at transfer. CBG at 1120 67 mg/dl. Pt given 4 oz orange juice, resulting cgb 83 mg/dl. Patients family at bedside, will meet patient at [facility].

## 2012-08-28 NOTE — Plan of Care (Signed)
Problem: Consults Goal: Pneumonia Patient Education See Patient Educatio Module for education specifics. Outcome: Progressing Discussing pneumonia affecting left lobe, overload of fluid and hypertension.  CTDB encouraged.  Patient standing at bedside to void. Goal: Skin Care Protocol Initiated - if Braden Score 18 or less If consults are not indicated, leave blank or document N/A Outcome: Progressing Skin dry and intact Goal: Nutrition Consult-if indicated Outcome: Progressing Patient lives alone, is a healthy older male.  Could benefit with increased appetite Goal: Diabetes Guidelines if Diabetic/Glucose > 140 If diabetic or lab glucose is > 140 mg/dl - Initiate Diabetes/Hyperglycemia Guidelines & Document Interventions  Outcome: Progressing Sliding scale coverage ordered  Problem: Phase I Progression Outcomes Goal: Pain controlled with appropriate interventions Outcome: Completed/Met Date Met:  08/28/12 No complaints of pain Goal: OOB as tolerated unless otherwise ordered Outcome: Progressing Patient standing at bedside to void throughout the night Goal: Code status addressed with pt/family Outcome: Completed/Met Date Met:  08/28/12 Patient is a full code Goal: Initial discharge plan identified Outcome: Progressing Patient lives at home alone, usually independent Goal: Hemodynamically stable Outcome: Progressing Hypertensive episodes throughout the night

## 2012-08-28 NOTE — Progress Notes (Signed)
ANTICOAGULATION CONSULT NOTE  Pharmacy Consult for Heparin Indication: chest pain/ACS  No Known Allergies  Patient Measurements: Height: 5\' 11"  (180.3 cm) Weight: 160 lb (72.576 kg) IBW/kg (Calculated) : 75.3 Heparin Dosing Weight: 75 kg  Vital Signs: Temp: 98.5 F (36.9 C) (05/10 1300) Temp src: Oral (05/10 1300) BP: 175/89 mmHg (05/10 0900) Pulse Rate: 92 (05/10 0800)  Labs:  Recent Labs  08/27/12 1220 08/27/12 1800 08/27/12 2309 08/28/12 0522 08/28/12 0759 08/28/12 1620  HGB 12.0*  --   --  10.9* 11.6*  --   HCT 34.9*  --   --  31.0* 33.2*  --   PLT 160  --   --  151 156  --   LABPROT  --   --   --   --  14.2  --   INR  --   --   --   --  1.11  --   HEPARINUNFRC  --   --   --   --  0.60 0.52  CREATININE 1.45*  --   --  1.62*  --   --   TROPONINI 1.40* 2.10* 2.45* 2.38*  --   --    Estimated Creatinine Clearance: 39.2 ml/min (by C-G formula based on Cr of 1.62).  Medical History: Past Medical History  Diagnosis Date  . Hypertension   . Diabetes mellitus without complication     Type 2  . Hyperlipidemia   . CAD (coronary artery disease)   . PAD (peripheral artery disease)   . Throat cancer    Medications:  Prescriptions prior to admission  Medication Sig Dispense Refill  . amLODipine (NORVASC) 10 MG tablet Take 1 tablet (10 mg total) by mouth daily.  30 tablet  11  . aspirin 81 MG EC tablet Take 81 mg by mouth daily.        Marland Kitchen atorvastatin (LIPITOR) 80 MG tablet Take 1 tablet (80 mg total) by mouth daily.  30 tablet  11  . carvedilol (COREG) 25 MG tablet Take 1 tablet (25 mg total) by mouth 2 (two) times daily with a meal.  60 tablet  11  . clopidogrel (PLAVIX) 75 MG tablet Take 1 tablet (75 mg total) by mouth daily. Pt. Is due for OV and needs to schedule  30 tablet  11  . glipiZIDE (GLUCOTROL) 10 MG tablet Take 10 mg by mouth 2 (two) times daily before a meal.        . hydrochlorothiazide (HYDRODIURIL) 25 MG tablet Take 1 tablet (25 mg total) by mouth  daily.  30 tablet  11  . lisinopril (PRINIVIL,ZESTRIL) 40 MG tablet Take 1 tablet (40 mg total) by mouth daily.  30 tablet  11  . metFORMIN (GLUCOPHAGE) 500 MG tablet Take 1,000 mg by mouth 2 (two) times daily with a meal.       . pantoprazole (PROTONIX) 20 MG tablet Take 1 tablet (20 mg total) by mouth daily.  30 tablet  0  . tamsulosin (FLOMAX) 0.4 MG CAPS Take 1 capsule (0.4 mg total) by mouth at bedtime.  30 capsule  11   Scheduled:  . amLODipine  10 mg Oral Daily  . antiseptic oral rinse  15 mL Mouth Rinse QID  . aspirin EC  81 mg Oral Daily  . atorvastatin  80 mg Oral q1800  . carvedilol  12.5 mg Oral BID WC  . clopidogrel  75 mg Oral Q breakfast  . furosemide  40 mg Intravenous Q12H  . [COMPLETED] furosemide  80 mg Intravenous Once  . [COMPLETED] heparin  2,000 Units Intravenous Once  . insulin aspart  0-15 Units Subcutaneous TID WC  . insulin detemir  10 Units Subcutaneous QHS  . levofloxacin  500 mg Oral Daily  . nitroGLYCERIN  0.5 inch Topical Q6H  . pantoprazole  40 mg Oral Daily  . potassium chloride  40 mEq Oral BID  . sodium chloride  3 mL Intravenous Q12H  . tamsulosin  0.4 mg Oral QHS  . [DISCONTINUED] aspirin EC  81 mg Oral Daily  . [DISCONTINUED] aspirin  325 mg Oral Daily  . [DISCONTINUED] aspirin  325 mg Oral Daily  . [DISCONTINUED] azithromycin  500 mg Intravenous Q24H  . [DISCONTINUED] carvedilol  6.25 mg Oral BID WC  . [DISCONTINUED] cefTRIAXone (ROCEPHIN)  IV  1 g Intravenous Q24H  . [DISCONTINUED] clopidogrel  75 mg Oral Daily  . [DISCONTINUED] heparin  5,000 Units Subcutaneous Q8H  . [DISCONTINUED] heparin  5,000 Units Subcutaneous Q8H  . [DISCONTINUED] pantoprazole  20 mg Oral Daily  . [DISCONTINUED] pantoprazole (PROTONIX) IV  40 mg Intravenous QHS  . [DISCONTINUED] potassium chloride  40 mEq Oral Daily   Assessment: 79 yr old male on heparin for chest pain/ACS.  No bleeding issues noted.    Heparin level at goal.  Goal of Therapy:  Heparin level  0.3-0.7 units/ml Monitor platelets by anticoagulation protocol: Yes   Plan:  Continue Heparin at 1050 units/hr. Daily heparin level/cbc.  Wendie Simmer, PharmD, BCPS Clinical Pharmacist  Pager: [phone]

## 2012-08-28 NOTE — Progress Notes (Signed)
Hypertension 200/100, called MD.  Order for prn hydralazine 10mg . Given,  Nitro gtt running at 35mcg/min.  No CP but patient does have SOB and tachypnea

## 2012-08-28 NOTE — Progress Notes (Signed)
ANTICOAGULATION CONSULT NOTE  Pharmacy Consult for Heparin Indication: chest pain/ACS  No Known Allergies  Patient Measurements: Height: 5\' 11"  (180.3 cm) Weight: 173 lb 1 oz (78.5 kg) IBW/kg (Calculated) : 75.3 Heparin Dosing Weight: 75 kg  Vital Signs: Temp: 98.8 F (37.1 C) (05/10 0730) Temp src: Oral (05/10 0730) BP: 175/89 mmHg (05/10 0900) Pulse Rate: 92 (05/10 0800)  Labs:  Recent Labs  08/27/12 1220 08/27/12 1800 08/27/12 2309 08/28/12 0522 08/28/12 0759  HGB 12.0*  --   --  10.9* 11.6*  HCT 34.9*  --   --  31.0* 33.2*  PLT 160  --   --  151 156  LABPROT  --   --   --   --  14.2  INR  --   --   --   --  1.11  HEPARINUNFRC  --   --   --   --  0.60  CREATININE 1.45*  --   --  1.62*  --   TROPONINI 1.40* 2.10* 2.45* 2.38*  --    Estimated Creatinine Clearance: 40.7 ml/min (by C-G formula based on Cr of 1.62).  Medical History: Past Medical History  Diagnosis Date  . Hypertension   . Diabetes mellitus without complication     Type 2  . Hyperlipidemia   . CAD (coronary artery disease)   . PAD (peripheral artery disease)   . Throat cancer    Medications:  Prescriptions prior to admission  Medication Sig Dispense Refill  . amLODipine (NORVASC) 10 MG tablet Take 1 tablet (10 mg total) by mouth daily.  30 tablet  11  . aspirin 81 MG EC tablet Take 81 mg by mouth daily.        Marland Kitchen atorvastatin (LIPITOR) 80 MG tablet Take 1 tablet (80 mg total) by mouth daily.  30 tablet  11  . carvedilol (COREG) 25 MG tablet Take 1 tablet (25 mg total) by mouth 2 (two) times daily with a meal.  60 tablet  11  . clopidogrel (PLAVIX) 75 MG tablet Take 1 tablet (75 mg total) by mouth daily. Pt. Is due for OV and needs to schedule  30 tablet  11  . glipiZIDE (GLUCOTROL) 10 MG tablet Take 10 mg by mouth 2 (two) times daily before a meal.        . hydrochlorothiazide (HYDRODIURIL) 25 MG tablet Take 1 tablet (25 mg total) by mouth daily.  30 tablet  11  . lisinopril  (PRINIVIL,ZESTRIL) 40 MG tablet Take 1 tablet (40 mg total) by mouth daily.  30 tablet  11  . metFORMIN (GLUCOPHAGE) 500 MG tablet Take 1,000 mg by mouth 2 (two) times daily with a meal.       . pantoprazole (PROTONIX) 20 MG tablet Take 1 tablet (20 mg total) by mouth daily.  30 tablet  0  . tamsulosin (FLOMAX) 0.4 MG CAPS Take 1 capsule (0.4 mg total) by mouth at bedtime.  30 capsule  11   Scheduled:  . [COMPLETED] sodium chloride   Intravenous Once  . [COMPLETED] albuterol  2.5 mg Nebulization Once  . amLODipine  10 mg Oral Daily  . antiseptic oral rinse  15 mL Mouth Rinse QID  . aspirin  325 mg Oral Daily  . atorvastatin  80 mg Oral q1800  . [COMPLETED] azithromycin (ZITHROMAX) 500 MG IVPB  500 mg Intravenous Once  . carvedilol  12.5 mg Oral BID WC  . [COMPLETED] cefTRIAXone (ROCEPHIN) IVPB 1 gram/50 mL D5W  1 g  Intravenous Once  . clopidogrel  75 mg Oral Q breakfast  . furosemide  40 mg Intravenous Q12H  . [COMPLETED] furosemide  80 mg Intravenous Once  . [COMPLETED] furosemide  80 mg Intravenous Once  . [COMPLETED] heparin  2,000 Units Intravenous Once  . insulin aspart  0-15 Units Subcutaneous TID WC  . insulin detemir  10 Units Subcutaneous QHS  . levofloxacin  500 mg Oral Daily  . nitroGLYCERIN  0.5 inch Topical Q6H  . pantoprazole  40 mg Oral Daily  . potassium chloride  40 mEq Oral BID  . sodium chloride  3 mL Intravenous Q12H  . tamsulosin  0.4 mg Oral QHS  . [DISCONTINUED] aspirin EC  81 mg Oral Daily  . [DISCONTINUED] aspirin  325 mg Oral Daily  . [DISCONTINUED] azithromycin  500 mg Intravenous Q24H  . [DISCONTINUED] carvedilol  6.25 mg Oral BID WC  . [DISCONTINUED] cefTRIAXone (ROCEPHIN)  IV  1 g Intravenous Q24H  . [DISCONTINUED] clopidogrel  75 mg Oral Daily  . [DISCONTINUED] heparin  5,000 Units Subcutaneous Q8H  . [DISCONTINUED] heparin  5,000 Units Subcutaneous Q8H  . [DISCONTINUED] pantoprazole  20 mg Oral Daily  . [DISCONTINUED] pantoprazole (PROTONIX) IV  40  mg Intravenous QHS  . [DISCONTINUED] potassium chloride  40 mEq Oral Daily   Assessment: Okay for Protocol Estimated Creatinine Clearance: 40.7 ml/min (by C-G formula based on Cr of 1.62). Patient with rising troponin being converted to IV heparin.  He received heparin 5000 units SQ around 10pm last evening.  No bleeding issues noted.  Baseline anticoag labs WNL.  Heparin level at goal.  Goal of Therapy:  Heparin level 0.3-0.7 units/ml Monitor platelets by anticoagulation protocol: Yes   Plan:  Continue Heparin at 1050 units/hr. Monitor daily labs.  Lamonte Richer R 08/28/2012,10:48 AM

## 2012-08-28 NOTE — Progress Notes (Signed)
Subjective: This man is feeling much improved compared to yesterday. He has had a good diuresis of approximately 3 L. He is now on nasal cannula oxygen 3 L per minute.           Physical Exam: Blood pressure 146/79, pulse 80, temperature 98.6 F (37 C), temperature source Oral, resp. rate 21, height 5\' 11"  (1.803 m), weight 78.5 kg (173 lb 1 oz), SpO2 89.00%. He looks much improved. There is no increased work of breathing. Heart sounds are present and without murmurs or added sounds. Jugular venous pressure not raised. Lung fields show less inspiratory crackles more on the right than the left than yesterday. There is still peripheral pitting edema in his legs but less so than yesterday. He is alert and orientated.   Investigations:  Recent Results (from the past 240 hour(s))  MRSA PCR SCREENING     Status: None   Collection Time    08/27/12  6:25 PM      Result Value Range Status   MRSA by PCR NEGATIVE  NEGATIVE Final   Comment:            The GeneXpert MRSA Assay (FDA     approved for NASAL specimens     only), is one component of a     comprehensive MRSA colonization     surveillance program. It is not     intended to diagnose MRSA     infection nor to guide or     monitor treatment for     MRSA infections.     Basic Metabolic Panel:  Recent Labs  16/10/96 1220 08/28/12 0522  NA 141 141  K 3.5 3.0*  CL 102 100  CO2 25 31  GLUCOSE 187* 173*  BUN 28* 30*  CREATININE 1.45* 1.62*  CALCIUM 8.6 8.4   Liver Function Tests:  Recent Labs  08/27/12 1800  AST 24  ALT 12  ALKPHOS 52  BILITOT 0.5  PROT 6.6  ALBUMIN 3.4*     CBC:  Recent Labs  08/27/12 1220 08/28/12 0522  WBC 9.9 8.6  NEUTROABS 8.9*  --   HGB 12.0* 10.9*  HCT 34.9* 31.0*  MCV 80.0 78.3  PLT 160 151    Dg Chest 2 View  08/27/2012  *RADIOLOGY REPORT*  Clinical Data: Shortness of breath  CHEST - 2 VIEW  Comparison: 08/23/2012  Findings: Cardiomediastinal silhouette is stable.   Central bronchitic changes.  Worsening streaky airspace disease in the right middle lobe suspicious for worsening pneumonia.  Probable small right pleural effusion.  IMPRESSION:  Central bronchitic changes.  Worsening airspace disease right middle lobe suspicious for worsening pneumonia.   Original Report Authenticated By: Natasha Mead, M.D.    Dg Chest Port 1 View  08/28/2012  *RADIOLOGY REPORT*  Clinical Data: Acute respiratory failure.  Heart failure.  Renal failure.  Diabetes.  Hypertension.  PORTABLE CHEST - 1 VIEW  Comparison: 08/27/2012  Findings: The patient is rotated to the right on today's exam, resulting in reduced diagnostic sensitivity and specificity. Cardiothoracic index 57%, compatible with mild cardiomegaly. Improved aeration noted at the lung bases.  No pleural effusion observed.  IMPRESSION:  1.  Significantly improved aeration of the lung bases. 2.  Mild cardiomegaly.   Original Report Authenticated By: Gaylyn Rong, M.D.       Medications: I have reviewed the patient's current medications.  Impression: 1. Acute congestive heart failure. 2. Probable non-ST elevation myocardial infarction precipitating #1. 3. Acute renal  failure secondary to #1. 4. Uncontrolled hypertension. 5. Type 2 diabetes mellitus. 6. Possible lung infection superimposed.     Plan: 1. Reinstitute amlodipine 10 mg daily and increase carvedilol to 12.5 mg twice a day. 2. Discontinue nitroglycerin drip and start nitro paste. 3. Continue heparin drip.  4. Lasix 40 mg IV twice a day. 5. I've spoken with [place] cardiology at [facility] and they've accepted the patient in transfer. Accepting physician will be Dr. Patty Sermons.     LOS: 1 day   Wilson Singer Pager [phone]  08/28/2012, 8:09 AM

## 2012-08-28 NOTE — H&P (Signed)
History and Physical  Patient IDAdric [name] MRN: [id number], SOB: 10/19/1933 79 y.o. Date of Encounter: 08/28/2012, 1:49 PM  Primary Physician: Harlow Asa, MD Primary Cardiologist: Lincoln [name], MD   HPI: 79 year old male, with known CAD and NL LVF, who presented to [facility] yesterday with progressive dyspnea. He was admitted with acute respiratory failure and was placed on IV Lasix and BiPAP, and has had a brisk diuretic response of approximately 3 L. Of note, proBNP 6200 on admission, currently at 12,000. He was also placed on IV antibiotics for treatment of probable CAP, as noted on CXR.  He presented with no complaint of chest pain. Serial cardiac markers were drawn, however, and notable for abnormal troponins of 1.4 on admission, to a peak of 2.5  Initial EKG indicated NSR at 100 bpm with LVH and no acute changes.  Patient is now transferred directly from [facility] ED for further evaluation and management.   Past Medical History  Diagnosis Date  . Hypertension   . Diabetes mellitus without complication     Type 2  . Hyperlipidemia   . CAD (coronary artery disease)   . PAD (peripheral artery disease)   . Throat cancer      Surgical History:  Past Surgical History  Procedure Laterality Date  . Throat surgery       Home Meds: Prior to Admission medications   Medication Sig Start Date End Date Taking? Authorizing Provider  amLODipine (NORVASC) 10 MG tablet Take 1 tablet (10 mg total) by mouth daily. 09/08/11  Yes Jodelle Gross, NP  aspirin 81 MG EC tablet Take 81 mg by mouth daily.     Yes Historical Provider, MD  atorvastatin (LIPITOR) 80 MG tablet Take 1 tablet (80 mg total) by mouth daily. 09/08/11  Yes Jodelle Gross, NP  carvedilol (COREG) 25 MG tablet Take 1 tablet (25 mg total) by mouth 2 (two) times daily with a meal. 09/08/11  Yes Jodelle Gross, NP  clopidogrel (PLAVIX) 75 MG tablet Take 1 tablet (75 mg total) by mouth daily. Pt. Is due for OV and  needs to schedule 09/08/11  Yes Jodelle Gross, NP  glipiZIDE (GLUCOTROL) 10 MG tablet Take 10 mg by mouth 2 (two) times daily before a meal.     Yes Historical Provider, MD  hydrochlorothiazide (HYDRODIURIL) 25 MG tablet Take 1 tablet (25 mg total) by mouth daily. 09/08/11  Yes Jodelle Gross, NP  lisinopril (PRINIVIL,ZESTRIL) 40 MG tablet Take 1 tablet (40 mg total) by mouth daily. 09/08/11  Yes Jodelle Gross, NP  metFORMIN (GLUCOPHAGE) 500 MG tablet Take 1,000 mg by mouth 2 (two) times daily with a meal.    Yes Historical Provider, MD  pantoprazole (PROTONIX) 20 MG tablet Take 1 tablet (20 mg total) by mouth daily. 08/23/12  Yes Benny Lennert, MD  tamsulosin (FLOMAX) 0.4 MG CAPS Take 1 capsule (0.4 mg total) by mouth at bedtime. 07/13/12  Yes Merlyn Albert, MD    Allergies: No Known Allergies  History   Social History  . Marital Status: Widowed    Spouse Name: N/A    Number of Children: N/A  . Years of Education: N/A   Occupational History  . Not on file.   Social History Main Topics  . Smoking status: Former Games developer  . Smokeless tobacco: Not on file  . Alcohol Use: No  . Drug Use: No  . Sexually Active: Not on file   Other Topics Concern  .  Not on file   Social History Narrative  . No narrative on file     Family History  Problem Relation Age of Onset  . Diabetes Mother   . Heart attack Brother     Review of Systems: General: negative for chills, fever, night sweats or weight changes.  Cardiovascular: Positive for orthopnea, PND, but no peripheral edema. Denies CP Dermatological: negative for rash Respiratory: negative for cough or wheezing Urologic: negative for hematuria Abdominal: negative for nausea, vomiting, diarrhea, bright red blood per rectum, melena, or hematemesis Neurologic: negative for visual changes, syncope, or dizziness All other systems reviewed and are otherwise negative except as noted above.  Labs:   Lab Results  Component  Value Date   WBC 8.7 08/28/2012   HGB 11.6* 08/28/2012   HCT 33.2* 08/28/2012   MCV 79.4 08/28/2012   PLT 156 08/28/2012     Recent Labs Lab 08/27/12 1800 08/28/12 0522  NA  --  141  K  --  3.0*  CL  --  100  CO2  --  31  BUN  --  30*  CREATININE  --  1.62*  CALCIUM  --  8.4  PROT 6.6  --   BILITOT 0.5  --   ALKPHOS 52  --   ALT 12  --   AST 24  --   GLUCOSE  --  173*    Recent Labs  08/27/12 1220 08/27/12 1800 08/27/12 2309 08/28/12 0522  TROPONINI 1.40* 2.10* 2.45* 2.38*   Lab Results  Component Value Date   CHOL 128 08/10/2012   HDL 38* 08/10/2012   LDLCALC 73 08/10/2012   TRIG 84 08/10/2012   No results found for this basename: DDIMER    Radiology/Studies:  Dg Chest 2 View  08/27/2012  *RADIOLOGY REPORT*  Clinical Data: Shortness of breath  CHEST - 2 VIEW  Comparison: 08/23/2012  Findings: Cardiomediastinal silhouette is stable.  Central bronchitic changes.  Worsening streaky airspace disease in the right middle lobe suspicious for worsening pneumonia.  Probable small right pleural effusion.  IMPRESSION:  Central bronchitic changes.  Worsening airspace disease right middle lobe suspicious for worsening pneumonia.   Original Report Authenticated By: Natasha Mead, M.D.    Dg Chest Port 1 View  08/28/2012  *RADIOLOGY REPORT*  Clinical Data: Acute respiratory failure.  Heart failure.  Renal failure.  Diabetes.  Hypertension.  PORTABLE CHEST - 1 VIEW  Comparison: 08/27/2012  Findings: The patient is rotated to the right on today's exam, resulting in reduced diagnostic sensitivity and specificity. Cardiothoracic index 57%, compatible with mild cardiomegaly. Improved aeration noted at the lung bases.  No pleural effusion observed.  IMPRESSION:  1.  Significantly improved aeration of the lung bases. 2.  Mild cardiomegaly.   Original Report Authenticated By: Gaylyn Rong, M.D.      EKG: NSR at 100 bpm; NL axis; LVH; no acute changes  Physical Exam:  Blood pressure 175/89,  pulse 92, temperature 98.5 F (36.9 C), temperature source Oral, resp. rate 22, height 5\' 11"  (1.803 m), weight 160 lb (72.576 kg), SpO2 95.00%. General: Well developed, well nourished, in no acute distress. Head: Normocephalic, atraumatic, sclera non-icteric, no xanthomas, nares are without discharge.  Neck: Negative for carotid bruits. Positive JVD on the right, at 45. Lungs: Diminished this sounds in bases, with faint late crackles, no wheezes. Heart: RRR with S1 S2. No murmurs, rubs, or gallops appreciated. Abdomen: Soft, non-tender, non-distended with normoactive bowel sounds. No hepatomegaly. No rebound/guarding. No obvious abdominal  masses. Msk:  Strength and tone appear normal for age. Extremities: No significant peripheral edema. Neuro: Alert and oriented X 3. Moves all extremities spontaneously. Psych:  Responds to questions appropriately with a normal affect.     ASSESSMENT AND PLAN:  1 Acute respiratory failure  - Clinical status improved on IV Lasix  2 CAP  - On IV antibiotics  3 NST EMI/CAD   - DES RCA, 10/2008  - EF 60%  4 DM  5 HTN  6 HLD  PLAN: Recommendation is to continue aggressive diuretic management with plans to proceed with diagnostic coronary angiography and possible PCI, once clinically stable. Patient will continue on current medication regimen, including IV heparin, as well as IV antibiotics for treatment of CAP. He currently remains pain-free and serial troponins have leveled off. Of note, he has underlying chronic renal disease and will need close monitoring of renal function with serial BMETs. Given this, we will reassess LVF with an echocardiogram this weekend, so that coronary angiography can be done with minimal contrast.  I agree with Mr, Serpe's assessment and plan as noted above. Patient with known coronary disease and last cath 2010.  He presented with shortness of breath.  He denies chest pain but his cardiac enzymes are significantly elevated  and his proBNP is also significant elevated.  His chest x-ray yesterday showed both congestive heart failure and a right lower lobe infiltrate.  The infiltrate appears to be improved on today's film although the film was somewhat rotated.  His renal function is borderline and hold his metformin and follow his renal function closely in anticipation for cardiac catheterization on Monday, May 12. Karie Schwalbe 08/28/2012, 1:49 PM

## 2012-08-28 NOTE — Progress Notes (Signed)
Heparin gtt begun at 10.5cc/hr  at 0145 per order, with bolus of 2000 units. Monitoring for bleeding.  None noted

## 2012-08-28 NOTE — Progress Notes (Signed)
CRITICAL VALUE ALERT  Critical value received:  Troponin 2.38  Date of notification:  08/28/12  Time of notification:  0630  Critical value read back:yes  Nurse who received alert:  Rockwell Germany RN  MD notified (1st page):  Dr. Phillips Odor  Time of first page:  0630  MD notified (2nd page):  Time of second page:  Responding MD:  Dr. Phillips Odor  Time MD responded:  0630

## 2012-08-28 NOTE — Progress Notes (Signed)
ANTICOAGULATION CONSULT NOTE - Initial Consult  Pharmacy Consult for Heparin Indication: chest pain/ACS  No Known Allergies  Patient Measurements: Height: 5\' 11"  (180.3 cm) Weight: 167 lb (75.751 kg) IBW/kg (Calculated) : 75.3 Heparin Dosing Weight: 75 kg  Vital Signs: Temp: 98.8 F (37.1 C) (05/10 0000) Temp src: Oral (05/10 0000) BP: 140/61 mmHg (05/10 0015) Pulse Rate: 83 (05/10 0015)  Labs:  Recent Labs  08/27/12 1220 08/27/12 1800 08/27/12 2309  HGB 12.0*  --   --   HCT 34.9*  --   --   PLT 160  --   --   CREATININE 1.45*  --   --   TROPONINI 1.40* 2.10* 2.45*    Estimated Creatinine Clearance: 45.4 ml/min (by C-G formula based on Cr of 1.45).   Medical History: Past Medical History  Diagnosis Date  . Hypertension   . Diabetes mellitus without complication     Type 2  . Hyperlipidemia   . CAD (coronary artery disease)   . PAD (peripheral artery disease)   . Throat cancer     Medications:  Prescriptions prior to admission  Medication Sig Dispense Refill  . amLODipine (NORVASC) 10 MG tablet Take 1 tablet (10 mg total) by mouth daily.  30 tablet  11  . aspirin 81 MG EC tablet Take 81 mg by mouth daily.        Marland Kitchen atorvastatin (LIPITOR) 80 MG tablet Take 1 tablet (80 mg total) by mouth daily.  30 tablet  11  . carvedilol (COREG) 25 MG tablet Take 1 tablet (25 mg total) by mouth 2 (two) times daily with a meal.  60 tablet  11  . clopidogrel (PLAVIX) 75 MG tablet Take 1 tablet (75 mg total) by mouth daily. Pt. Is due for OV and needs to schedule  30 tablet  11  . glipiZIDE (GLUCOTROL) 10 MG tablet Take 10 mg by mouth 2 (two) times daily before a meal.        . hydrochlorothiazide (HYDRODIURIL) 25 MG tablet Take 1 tablet (25 mg total) by mouth daily.  30 tablet  11  . lisinopril (PRINIVIL,ZESTRIL) 40 MG tablet Take 1 tablet (40 mg total) by mouth daily.  30 tablet  11  . metFORMIN (GLUCOPHAGE) 500 MG tablet Take 1,000 mg by mouth 2 (two) times daily with a  meal.       . pantoprazole (PROTONIX) 20 MG tablet Take 1 tablet (20 mg total) by mouth daily.  30 tablet  0  . tamsulosin (FLOMAX) 0.4 MG CAPS Take 1 capsule (0.4 mg total) by mouth at bedtime.  30 capsule  11   Scheduled:  . [COMPLETED] sodium chloride   Intravenous Once  . [COMPLETED] albuterol  2.5 mg Nebulization Once  . antiseptic oral rinse  15 mL Mouth Rinse QID  . aspirin  325 mg Oral Daily  . atorvastatin  80 mg Oral q1800  . [COMPLETED] azithromycin (ZITHROMAX) 500 MG IVPB  500 mg Intravenous Once  . azithromycin  500 mg Intravenous Q24H  . carvedilol  6.25 mg Oral BID WC  . [COMPLETED] cefTRIAXone (ROCEPHIN) IVPB 1 gram/50 mL D5W  1 g Intravenous Once  . cefTRIAXone (ROCEPHIN)  IV  1 g Intravenous Q24H  . clopidogrel  75 mg Oral Q breakfast  . [COMPLETED] furosemide  80 mg Intravenous Once  . furosemide  80 mg Intravenous Once  . insulin aspart  0-15 Units Subcutaneous TID WC  . pantoprazole (PROTONIX) IV  40 mg Intravenous QHS  .  potassium chloride  40 mEq Oral Daily  . sodium chloride  3 mL Intravenous Q12H  . tamsulosin  0.4 mg Oral QHS  . [DISCONTINUED] aspirin EC  81 mg Oral Daily  . [DISCONTINUED] aspirin  325 mg Oral Daily  . [DISCONTINUED] clopidogrel  75 mg Oral Daily  . [DISCONTINUED] heparin  5,000 Units Subcutaneous Q8H  . [DISCONTINUED] heparin  5,000 Units Subcutaneous Q8H  . [DISCONTINUED] pantoprazole  20 mg Oral Daily    Assessment: Okay for Protocol Estimated Creatinine Clearance: 45.4 ml/min (by C-G formula based on Cr of 1.45). Patient with rising troponin being converted to IV heparin.  He received heparin 5000 units SQ around 10pm last evening.  No bleeding issues noted per RN.  Last baseline anticoag labs available in [facility] form 2010 were WNL.  Goal of Therapy:  Heparin level 0.3-0.7 units/ml Monitor platelets by anticoagulation protocol: Yes   Plan:  Give 2000 units bolus x 1 Start heparin infusion at 1050 units/hr Check anti-Xa level in  6-8 hours and daily while on heparin Continue to monitor H&H and platelets  Lamonte Richer R 08/28/2012,1:20 AM

## 2012-08-28 NOTE — Progress Notes (Signed)
CRITICAL VALUE ALERT  Critical value received:  Troponin 2.45  Date of notification:  08/27/12  Time of notification:  2315  Critical value read back:yes  Nurse who received alert:  Consuello Masse RN  MD notified (1st page):  Dr. Phillips Odor  Time of first page:  2315  MD notified (2nd page):  Time of second page:  Responding MD: Dr. Phillips Odor  Time MD responded:  2315

## 2012-08-29 ENCOUNTER — Inpatient Hospital Stay (HOSPITAL_COMMUNITY): Payer: Medicare Other

## 2012-08-29 DIAGNOSIS — I517 Cardiomegaly: Secondary | ICD-10-CM

## 2012-08-29 LAB — CBC
MCV: 79.7 fL (ref 78.0–100.0)
Platelets: 132 10*3/uL — ABNORMAL LOW (ref 150–400)
RBC: 3.95 MIL/uL — ABNORMAL LOW (ref 4.22–5.81)
RDW: 13.7 % (ref 11.5–15.5)
WBC: 6.8 10*3/uL (ref 4.0–10.5)

## 2012-08-29 LAB — BASIC METABOLIC PANEL
Calcium: 8.3 mg/dL — ABNORMAL LOW (ref 8.4–10.5)
GFR calc Af Amer: 36 mL/min — ABNORMAL LOW (ref >90)
GFR calc non Af Amer: 31 mL/min — ABNORMAL LOW (ref >90)
Potassium: 3.7 mEq/L (ref 3.5–5.1)
Sodium: 143 mEq/L (ref 135–145)

## 2012-08-29 LAB — PRO B NATRIURETIC PEPTIDE: Pro B Natriuretic peptide (BNP): 3413 pg/mL — ABNORMAL HIGH (ref 0–450)

## 2012-08-29 LAB — GLUCOSE, CAPILLARY
Glucose-Capillary: 121 mg/dL — ABNORMAL HIGH (ref 70–99)
Glucose-Capillary: 90 mg/dL (ref 70–99)
Glucose-Capillary: 168 mg/dL — ABNORMAL HIGH (ref 70–99)
Glucose-Capillary: 120 mg/dL — ABNORMAL HIGH (ref 70–99)

## 2012-08-29 MED ORDER — SODIUM CHLORIDE 0.9 % IV SOLN
250.0000 mL | INTRAVENOUS | Status: DC | PRN
  Administered 2012-08-29: 13:00:00 via INTRAVENOUS

## 2012-08-29 MED ORDER — SODIUM CHLORIDE 0.9 % IV SOLN
250.0000 mL | INTRAVENOUS | Status: DC
  Administered 2012-08-30: 250 mL via INTRAVENOUS
  Administered 2012-08-30 – 2012-08-31 (×2): 1000 mL via INTRAVENOUS

## 2012-08-29 NOTE — Progress Notes (Signed)
Patient: [name] Date of Encounter: 08/29/2012, 7:37 AM Admit date: 08/27/2012     Subjective  Feels better, less SOB. No CP   Objective   Telemetry: NSR Physical Exam: Filed Vitals:   08/29/12 0542  BP: 168/89  Pulse: 75  Temp: 98.7 F (37.1 C)  Resp: 27   General: Well developed, well nourished, in no acute distress. Head: Normocephalic, atraumatic, sclera non-icteric, no xanthomas, nares are without discharge.  Neck: Positive JVD on R. Lungs: Clear bilaterally to auscultation without wheezes, rales, or rhonchi. Breathing is unlabored. Heart: RRR S1 S2 without murmurs, rubs, or gallops.  Abdomen: Soft, non-tender, non-distended with normoactive bowel sounds. No hepatomegaly. No rebound/guarding. No obvious abdominal masses. Msk:  Strength and tone appear normal for age. Extremities: No edema. Neuro: Alert and oriented X 3. Moves all extremities spontaneously. Psych:  Responds to questions appropriately with a normal affect.    Intake/Output Summary (Last 24 hours) at 08/29/12 0737 Last data filed at 08/29/12 0543  Gross per 24 hour  Intake    240 ml  Output   3625 ml  Net  -3385 ml    Inpatient Medications:  . amLODipine  10 mg Oral Daily  . antiseptic oral rinse  15 mL Mouth Rinse QID  . aspirin EC  81 mg Oral Daily  . atorvastatin  80 mg Oral q1800  . carvedilol  12.5 mg Oral BID WC  . clopidogrel  75 mg Oral Q breakfast  . furosemide  40 mg Intravenous Q12H  . insulin aspart  0-15 Units Subcutaneous TID WC  . insulin detemir  10 Units Subcutaneous QHS  . levofloxacin  500 mg Oral Daily  . nitroGLYCERIN  0.5 inch Topical Q6H  . pantoprazole  40 mg Oral Daily  . potassium chloride  40 mEq Oral BID  . sodium chloride  3 mL Intravenous Q12H  . tamsulosin  0.4 mg Oral QHS    Labs:  Recent Labs  08/28/12 0522 08/29/12 0340  NA 141 143  K 3.0* 3.7  CL 100 103  CO2 31 35*  GLUCOSE 173* 157*  BUN 30* 33*  CREATININE 1.62* 1.95*  CALCIUM 8.4  8.3*    Recent Labs  08/27/12 1800  AST 24  ALT 12  ALKPHOS 52  BILITOT 0.5  PROT 6.6  ALBUMIN 3.4*   No results found for this basename: LIPASE, AMYLASE,  in the last 72 hours  Recent Labs  08/27/12 1220  08/28/12 0759 08/29/12 0340  WBC 9.9  < > 8.7 6.8  NEUTROABS 8.9*  --   --   --   HGB 12.0*  < > 11.6* 10.7*  HCT 34.9*  < > 33.2* 31.5*  MCV 80.0  < > 79.4 79.7  PLT 160  < > 156 132*  < > = values in this interval not displayed.  Recent Labs  08/27/12 1220 08/27/12 1800 08/27/12 2309 08/28/12 0522  TROPONINI 1.40* 2.10* 2.45* 2.38*   No components found with this basename: POCBNP,  No results found for this basename: DDIMER,  in the last 72 hours  Recent Labs  08/27/12 1800  HGBA1C 6.6*   No results found for this basename: CHOL, HDL, LDLCALC, TRIG, CHOLHDL,  in the last 72 hours No results found for this basename: TSH, T4TOTAL, FREET3, T3FREE, THYROIDAB,  in the last 72 hours No results found for this basename: VITAMINB12, FOLATE, FERRITIN, TIBC, IRON, RETICCTPCT,  in the last 72 hours  Radiology/Studies:  Dg Chest 2  View  08/27/2012  *RADIOLOGY REPORT*  Clinical Data: Shortness of breath  CHEST - 2 VIEW  Comparison: 08/23/2012  Findings: Cardiomediastinal silhouette is stable.  Central bronchitic changes.  Worsening streaky airspace disease in the right middle lobe suspicious for worsening pneumonia.  Probable small right pleural effusion.  IMPRESSION:  Central bronchitic changes.  Worsening airspace disease right middle lobe suspicious for worsening pneumonia.   Original Report Authenticated By: Natasha Mead, M.D.     Dg Chest Port 1 View  08/28/2012  *RADIOLOGY REPORT*  Clinical Data: Acute respiratory failure.  Heart failure.  Renal failure.  Diabetes.  Hypertension.  PORTABLE CHEST - 1 VIEW  Comparison: 08/27/2012  Findings: The patient is rotated to the right on today's exam, resulting in reduced diagnostic sensitivity and specificity. Cardiothoracic index  57%, compatible with mild cardiomegaly. Improved aeration noted at the lung bases.  No pleural effusion observed.  IMPRESSION:  1.  Significantly improved aeration of the lung bases. 2.  Mild cardiomegaly.   Original Report Authenticated By: Gaylyn Rong, M.D.          Assessment and Plan  1 Acute respiratory failure   - Clinical status improved on IV Lasix  2 CAP   - On PO Levaquin, currently 3 NST EMI/CAD   - peak Tn 2.5  - DES RCA, 10/2008   - EF 60%  3 CKD, creatinine trending up 4 DM  5 HTN  6 HLD  PLAN: Continue Rx with ASA, Plavix, IV Heparin, coreg. Will DC Lasix: neg 3.4 L/24 hrs, and creatinine trending up to 2.0. Pt not on ACE. F/u echo today. Defer cardiac cath, pending clinical course. If needed, recommend Cors only (no LVG). Will check pro BNP today, and f/u labs in AM.   Signed, SERPE, EUGENE PA-C  Agree with assessment above.  He appears now to be euvolemic or slightly dry.  Lying flat comfortably. His creatinine is up further and we have stopped his lasix. Will increase IV saline to 50 cc/hr.  Will hold breakfast in am in case his renal function improves to the point that he could have cath tomorrow. 2D echo being done now.

## 2012-08-29 NOTE — Progress Notes (Signed)
ANTICOAGULATION CONSULT NOTE  Pharmacy Consult for Heparin Indication: chest pain/ACS  No Known Allergies  Patient Measurements: Height: 5\' 11"  (180.3 cm) Weight: 158 lb 11.7 oz (72 kg) IBW/kg (Calculated) : 75.3 Heparin Dosing Weight: 75 kg  Vital Signs: Temp: 98.3 F (36.8 C) (05/11 0800) Temp src: Oral (05/11 0800) BP: 179/90 mmHg (05/11 0800) Pulse Rate: 83 (05/11 0800)  Labs:  Recent Labs  08/27/12 1220 08/27/12 1800 08/27/12 2309 08/28/12 0522 08/28/12 0759 08/28/12 1620 08/29/12 0340  HGB 12.0*  --   --  10.9* 11.6*  --  10.7*  HCT 34.9*  --   --  31.0* 33.2*  --  31.5*  PLT 160  --   --  151 156  --  132*  LABPROT  --   --   --   --  14.2  --   --   INR  --   --   --   --  1.11  --   --   HEPARINUNFRC  --   --   --   --  0.60 0.52 0.47  CREATININE 1.45*  --   --  1.62*  --   --  1.95*  TROPONINI 1.40* 2.10* 2.45* 2.38*  --   --   --    Estimated Creatinine Clearance: 32.3 ml/min (by C-G formula based on Cr of 1.95).  Medical History: Past Medical History  Diagnosis Date  . Hypertension   . Diabetes mellitus without complication     Type 2  . Hyperlipidemia   . CAD (coronary artery disease)   . PAD (peripheral artery disease)   . Throat cancer    Medications:  Prescriptions prior to admission  Medication Sig Dispense Refill  . amLODipine (NORVASC) 10 MG tablet Take 1 tablet (10 mg total) by mouth daily.  30 tablet  11  . aspirin 81 MG EC tablet Take 81 mg by mouth daily.        Marland Kitchen atorvastatin (LIPITOR) 80 MG tablet Take 1 tablet (80 mg total) by mouth daily.  30 tablet  11  . carvedilol (COREG) 25 MG tablet Take 1 tablet (25 mg total) by mouth 2 (two) times daily with a meal.  60 tablet  11  . clopidogrel (PLAVIX) 75 MG tablet Take 1 tablet (75 mg total) by mouth daily. Pt. Is due for OV and needs to schedule  30 tablet  11  . glipiZIDE (GLUCOTROL) 10 MG tablet Take 10 mg by mouth 2 (two) times daily before a meal.        . hydrochlorothiazide  (HYDRODIURIL) 25 MG tablet Take 1 tablet (25 mg total) by mouth daily.  30 tablet  11  . lisinopril (PRINIVIL,ZESTRIL) 40 MG tablet Take 1 tablet (40 mg total) by mouth daily.  30 tablet  11  . metFORMIN (GLUCOPHAGE) 500 MG tablet Take 1,000 mg by mouth 2 (two) times daily with a meal.       . pantoprazole (PROTONIX) 20 MG tablet Take 1 tablet (20 mg total) by mouth daily.  30 tablet  0  . tamsulosin (FLOMAX) 0.4 MG CAPS Take 1 capsule (0.4 mg total) by mouth at bedtime.  30 capsule  11   Scheduled:  . amLODipine  10 mg Oral Daily  . antiseptic oral rinse  15 mL Mouth Rinse QID  . aspirin EC  81 mg Oral Daily  . atorvastatin  80 mg Oral q1800  . carvedilol  12.5 mg Oral BID WC  . clopidogrel  75 mg Oral Q breakfast  . insulin aspart  0-15 Units Subcutaneous TID WC  . insulin detemir  10 Units Subcutaneous QHS  . levofloxacin  500 mg Oral Daily  . nitroGLYCERIN  0.5 inch Topical Q6H  . pantoprazole  40 mg Oral Daily  . sodium chloride  3 mL Intravenous Q12H  . tamsulosin  0.4 mg Oral QHS  . [DISCONTINUED] aspirin  325 mg Oral Daily  . [DISCONTINUED] furosemide  40 mg Intravenous Q12H  . [DISCONTINUED] potassium chloride  40 mEq Oral BID   Assessment: 79 yr old male on heparin for chest pain/ACS, troponin's 2.38, plans for cath on May 12. No bleeding issues noted. Heparin level 0.47 at goal.  Goal of Therapy:  Heparin level 0.3-0.7 units/ml Monitor platelets by anticoagulation protocol: Yes   Plan:  1. Continue Heparin at 1050 units/hr. 2. Daily heparin level/cbc. 3. Monitor for s/sx of bleeding  Bola A. Wandra Feinstein D Clinical Pharmacist [phone] Phone [phone] 08/29/2012 9:04 AM

## 2012-08-29 NOTE — Progress Notes (Signed)
  Echocardiogram 2D Echocardiogram has been performed.  [name] 08/29/2012, 11:05 AM

## 2012-08-30 DIAGNOSIS — I5031 Acute diastolic (congestive) heart failure: Secondary | ICD-10-CM

## 2012-08-30 DIAGNOSIS — I214 Non-ST elevation (NSTEMI) myocardial infarction: Secondary | ICD-10-CM

## 2012-08-30 DIAGNOSIS — J189 Pneumonia, unspecified organism: Secondary | ICD-10-CM

## 2012-08-30 LAB — BASIC METABOLIC PANEL
CO2: 35 mEq/L — ABNORMAL HIGH (ref 19–32)
Chloride: 103 mEq/L (ref 96–112)
GFR calc Af Amer: 38 mL/min — ABNORMAL LOW (ref >90)
Potassium: 3.3 mEq/L — ABNORMAL LOW (ref 3.5–5.1)
Sodium: 142 mEq/L (ref 135–145)

## 2012-08-30 LAB — GLUCOSE, CAPILLARY
Glucose-Capillary: 68 mg/dL — ABNORMAL LOW (ref 70–99)
Glucose-Capillary: 154 mg/dL — ABNORMAL HIGH (ref 70–99)

## 2012-08-30 LAB — CBC
Hemoglobin: 10.5 g/dL — ABNORMAL LOW (ref 13.0–17.0)
MCH: 27.4 pg (ref 26.0–34.0)
Platelets: 125 10*3/uL — ABNORMAL LOW (ref 150–400)
RBC: 3.83 MIL/uL — ABNORMAL LOW (ref 4.22–5.81)
WBC: 6.4 10*3/uL (ref 4.0–10.5)

## 2012-08-30 LAB — HEPARIN LEVEL (UNFRACTIONATED): Heparin Unfractionated: 0.58 IU/mL (ref 0.30–0.70)

## 2012-08-30 MED ORDER — CARVEDILOL 6.25 MG PO TABS
18.7500 mg | ORAL_TABLET | Freq: Two times a day (BID) | ORAL | Status: DC
  Administered 2012-08-30 – 2012-09-01 (×4): 18.75 mg via ORAL
  Filled 2012-08-30 (×6): qty 1

## 2012-08-30 MED ORDER — POTASSIUM CHLORIDE CRYS ER 20 MEQ PO TBCR
40.0000 meq | EXTENDED_RELEASE_TABLET | Freq: Once | ORAL | Status: AC
  Administered 2012-08-30: 40 meq via ORAL

## 2012-08-30 MED ORDER — LEVOFLOXACIN 750 MG PO TABS
750.0000 mg | ORAL_TABLET | ORAL | Status: DC
  Administered 2012-08-30: 750 mg via ORAL
  Filled 2012-08-30 (×3): qty 1

## 2012-08-30 NOTE — Progress Notes (Signed)
Patient Name: [name] Date of Encounter: 08/30/2012   Principal Problem:   Acute respiratory failure Active Problems:   ATHEROSCLEROTIC CARDIOVASCULAR DISEASE   Acute diastolic CHF (congestive heart failure)   NSTEMI (non-ST elevated myocardial infarction)   DIABETES MELLITUS, TYPE II   Acute renal failure   Community acquired pneumonia   HYPERLIPIDEMIA   HYPERTENSION   SUBJECTIVE  No c/p or sob.  Slept well.  CURRENT MEDS . amLODipine  10 mg Oral Daily  . antiseptic oral rinse  15 mL Mouth Rinse QID  . aspirin EC  81 mg Oral Daily  . atorvastatin  80 mg Oral q1800  . carvedilol  12.5 mg Oral BID WC  . clopidogrel  75 mg Oral Q breakfast  . insulin aspart  0-15 Units Subcutaneous TID WC  . insulin detemir  10 Units Subcutaneous QHS  . levofloxacin  500 mg Oral Daily  . nitroGLYCERIN  0.5 inch Topical Q6H  . pantoprazole  40 mg Oral Daily  . sodium chloride  3 mL Intravenous Q12H  . tamsulosin  0.4 mg Oral QHS    OBJECTIVE  Filed Vitals:   08/29/12 2017 08/30/12 0056 08/30/12 0421 08/30/12 0730  BP: 132/60 133/58 150/84 176/83  Pulse: 75 79 76 74  Temp: 97.8 F (36.6 C) 98 F (36.7 C) 98.5 F (36.9 C) 98 F (36.7 C)  TempSrc: Oral Oral Oral Oral  Resp: 25 19 20 28   Height:      Weight:   158 lb 11.7 oz (72 kg)   SpO2: 99% 97% 97% 97%    Intake/Output Summary (Last 24 hours) at 08/30/12 0824 Last data filed at 08/30/12 0600  Gross per 24 hour  Intake 1566.5 ml  Output   2375 ml  Net -808.5 ml   Filed Weights   08/28/12 1300 08/29/12 0542 08/30/12 0421  Weight: 160 lb (72.576 kg) 158 lb 11.7 oz (72 kg) 158 lb 11.7 oz (72 kg)    PHYSICAL EXAM  General: Pleasant, NAD. Neuro: Alert and oriented X 3. Moves all extremities spontaneously. Psych: Normal affect. HEENT:  Normal  Neck: Supple without JVD. Lungs:  Resp regular and unlabored, diminished breath sounds bilat. Heart: RRR no s3, s4, or murmurs. Abdomen: Soft, non-tender,  non-distended, BS + x 4.  Extremities: No clubbing, cyanosis or edema. DP/PT/Radials 2+ and equal bilaterally.  Accessory Clinical Findings  CBC  Recent Labs  08/27/12 1220  08/29/12 0340 08/30/12 0340  WBC 9.9  < > 6.8 6.4  NEUTROABS 8.9*  --   --   --   HGB 12.0*  < > 10.7* 10.5*  HCT 34.9*  < > 31.5* 30.8*  MCV 80.0  < > 79.7 80.4  PLT 160  < > 132* 125*  < > = values in this interval not displayed. Basic Metabolic Panel  Recent Labs  08/29/12 0340 08/30/12 0340  NA 143 142  K 3.7 3.3*  CL 103 103  CO2 35* 35*  GLUCOSE 157* 102*  BUN 33* 33*  CREATININE 1.95* 1.90*  CALCIUM 8.3* 8.3*   Liver Function Tests  Recent Labs  08/27/12 1800  AST 24  ALT 12  ALKPHOS 52  BILITOT 0.5  PROT 6.6  ALBUMIN 3.4*   Cardiac Enzymes  Recent Labs  08/27/12 1800 08/27/12 2309 08/28/12 0522  TROPONINI 2.10* 2.45* 2.38*   Hemoglobin A1C  Recent Labs  08/27/12 1800  HGBA1C 6.6*   TELE  Rsr, pac's, pvc's, 4 beats nsvt, intermittent bundle.  2D Echocardiogram  Study Conclusions  - Left ventricle: The cavity size was normal. There was mild to moderate concentric hypertrophy. Systolic function was normal. The estimated ejection fraction was in the range of 50% to 55%. Wall motion was normal; there were no regional wall motion abnormalities. - Aortic valve: Mildly calcified annulus. Trileaflet. - Left atrium: The atrium was mildly dilated. - Atrial septum: The septum bowed from left to right, consistent with increased left atrial pressure. No defect or patent foramen ovale was identified. - Pulmonary arteries: PA peak pressure: 43mm Hg (S). _____________  Radiology/Studies  Dg Chest Port 1 View  08/29/2012  *RADIOLOGY REPORT*  Clinical Data: Congestive heart failure and pneumonia.  PORTABLE CHEST - 1 VIEW  Comparison: 08/28/2012  Findings: No residual infiltrate is identified.  No evidence of pulmonary edema or pleural fluid.  Heart size is stable.   IMPRESSION: No active disease.   Original Report Authenticated By: Irish Lack, M.D.    ASSESSMENT AND PLAN  1.  NSTEMI/CAD: no chest pain.  Pending cath as renal fxn stabilizes, perhaps tomorrow.  Cont asa, statin, bb, heparin, plavix.  2.  Acute diastolic CHF:  Lasix held yesterday 2/2 worsening renal fxn.  Weight stable this AM and euvolemic on exam.  Cont gentle hydration today in preparation for cath tomorrow.  Titrate bb.  3.  Acute renal failure:  In setting of diuresis.  Creat stable/sl better after hydration yesterday.  4.  HTN:  BP up this AM after being stable throughout the day yesterday.  Titrate bb.  5.  HL:  LDL 73 on 4/22.  LFT's nl.  Cont statin.  6.  DM: Cont ssi.  Metformin on hold.  7.  CAP:  CXR yesterday looked better.  Complete course of levaquin (Day 4/7).  8.  Hypokalemia:  Supp.  Signed, Nicolasa Ducking NP Patient seen and examined. I agree with the assessment and plan as detailed above. See also my additional thoughts below.   I agree with the plan as outlined. We will see what his renal function is tomorrow. Then we will decide if cath can be done.  Willa Rough, MD, [facility] 08/30/2012 9:15 AM

## 2012-08-30 NOTE — Progress Notes (Signed)
Notified C. Berge,P.A regarding IVF. Instructed to continue IVF and providers will round and make revisions as needed. Primary nurse notified.

## 2012-08-30 NOTE — Progress Notes (Signed)
ANTICOAGULATION CONSULT NOTE  Pharmacy Consult for Heparin Indication: chest pain/ACS  No Known Allergies  Patient Measurements: Height: 5\' 11"  (180.3 cm) Weight: 158 lb 11.7 oz (72 kg) IBW/kg (Calculated) : 75.3 Heparin Dosing Weight: 75 kg  Vital Signs: Temp: 98 F (36.7 C) (05/12 0730) Temp src: Oral (05/12 0730) BP: 176/83 mmHg (05/12 0730) Pulse Rate: 74 (05/12 0730)  Labs:  Recent Labs  08/27/12 1800 08/27/12 2309 08/28/12 0522  08/28/12 0759 08/28/12 1620 08/29/12 0340 08/30/12 0340  HGB  --   --  10.9*  --  11.6*  --  10.7* 10.5*  HCT  --   --  31.0*  --  33.2*  --  31.5* 30.8*  PLT  --   --  151  --  156  --  132* 125*  LABPROT  --   --   --   --  14.2  --   --   --   INR  --   --   --   --  1.11  --   --   --   HEPARINUNFRC  --   --   --   < > 0.60 0.52 0.47 0.58  CREATININE  --   --  1.62*  --   --   --  1.95* 1.90*  TROPONINI 2.10* 2.45* 2.38*  --   --   --   --   --   < > = values in this interval not displayed. Estimated Creatinine Clearance: 33.2 ml/min (by C-G formula based on Cr of 1.9).  Assessment: 79 yr old male on heparin for chest pain/ACS, troponin's 2.38, plans for cath today. No bleeding issues noted and platelet count is stable. Heparin level 0.58 at goal.  Goal of Therapy:  Heparin level 0.3-0.7 units/ml Monitor platelets by anticoagulation protocol: Yes   Plan:  1. Continue Heparin at 1050 units/hr. 2. Daily heparin level/cbc. 3. Monitor for s/sx of bleeding 4. Follow up after cardiac cath  Celedonio Miyamoto, PharmD, BCPS Clinical Pharmacist Pager [phone]   08/30/2012 9:22 AM

## 2012-08-31 ENCOUNTER — Encounter (HOSPITAL_COMMUNITY)
Admission: EM | Disposition: A | Discharge status: Home / Self Care | Payer: Self-pay | Source: Home / Self Care | Attending: Internal Medicine

## 2012-08-31 DIAGNOSIS — N189 Chronic kidney disease, unspecified: Secondary | ICD-10-CM

## 2012-08-31 DIAGNOSIS — I2510 Atherosclerotic heart disease of native coronary artery without angina pectoris: Secondary | ICD-10-CM

## 2012-08-31 HISTORY — PX: LEFT HEART CATHETERIZATION WITH CORONARY ANGIOGRAM: SHX5451

## 2012-08-31 LAB — CBC
HCT: 31.7 % — ABNORMAL LOW (ref 39.0–52.0)
MCH: 27.3 pg (ref 26.0–34.0)
MCV: 81.7 fL (ref 78.0–100.0)
Platelets: 128 10*3/uL — ABNORMAL LOW (ref 150–400)
RDW: 13.8 % (ref 11.5–15.5)
WBC: 7 10*3/uL (ref 4.0–10.5)

## 2012-08-31 LAB — BASIC METABOLIC PANEL
BUN: 25 mg/dL — ABNORMAL HIGH (ref 6–23)
Chloride: 108 mEq/L (ref 96–112)
Creatinine, Ser: 1.75 mg/dL — ABNORMAL HIGH (ref 0.50–1.35)
GFR calc Af Amer: 42 mL/min — ABNORMAL LOW (ref >90)
GFR calc non Af Amer: 36 mL/min — ABNORMAL LOW (ref >90)
Glucose, Bld: 54 mg/dL — ABNORMAL LOW (ref 70–99)
Potassium: 4.1 mEq/L (ref 3.5–5.1)

## 2012-08-31 LAB — GLUCOSE, CAPILLARY
Glucose-Capillary: 77 mg/dL (ref 70–99)
Glucose-Capillary: 244 mg/dL — ABNORMAL HIGH (ref 70–99)

## 2012-08-31 SURGERY — LEFT HEART CATHETERIZATION WITH CORONARY ANGIOGRAM
Anesthesia: LOCAL

## 2012-08-31 MED ORDER — ASPIRIN 81 MG PO CHEW
324.0000 mg | CHEWABLE_TABLET | ORAL | Status: AC
  Administered 2012-08-31: 324 mg via ORAL
  Filled 2012-08-31: qty 4

## 2012-08-31 MED ORDER — DIAZEPAM 2 MG PO TABS: 2.0000 mg | ORAL_TABLET | ORAL | Status: DC

## 2012-08-31 MED ORDER — HEPARIN (PORCINE) IN NACL 2-0.9 UNIT/ML-% IJ SOLN
INTRAMUSCULAR | Status: AC
  Filled 2012-08-31: qty 1000

## 2012-08-31 MED ORDER — SODIUM CHLORIDE 0.9 % IV SOLN: 250.0000 mL | INTRAVENOUS | Status: DC | PRN

## 2012-08-31 MED ORDER — LIDOCAINE HCL (PF) 1 % IJ SOLN
INTRAMUSCULAR | Status: AC
  Filled 2012-08-31: qty 30

## 2012-08-31 MED ORDER — SODIUM CHLORIDE 0.9 % IV SOLN
INTRAVENOUS | Status: AC
  Administered 2012-08-31: 12:00:00 via INTRAVENOUS

## 2012-08-31 MED ORDER — FENTANYL CITRATE 0.05 MG/ML IJ SOLN
INTRAMUSCULAR | Status: AC
  Filled 2012-08-31: qty 2

## 2012-08-31 MED ORDER — SODIUM CHLORIDE 0.9 % IJ SOLN: 3.0000 mL | Freq: Two times a day (BID) | INTRAMUSCULAR | Status: DC

## 2012-08-31 MED ORDER — MIDAZOLAM HCL 2 MG/2ML IJ SOLN
INTRAMUSCULAR | Status: AC
  Filled 2012-08-31: qty 2

## 2012-08-31 MED ORDER — ONDANSETRON HCL 4 MG/2ML IJ SOLN: 4.0000 mg | Freq: Four times a day (QID) | INTRAMUSCULAR | Status: DC | PRN

## 2012-08-31 MED ORDER — SODIUM CHLORIDE 0.9 % IJ SOLN: 3.0000 mL | INTRAMUSCULAR | Status: DC | PRN

## 2012-08-31 MED ORDER — DIAZEPAM 5 MG PO TABS
ORAL_TABLET | ORAL | Status: AC
  Filled 2012-08-31: qty 1

## 2012-08-31 NOTE — Progress Notes (Signed)
TR BAND REMOVAL  LOCATION:    right radial  DEFLATED PER PROTOCOL:    yes  TIME BAND OFF / DRESSING APPLIED:    1430   SITE UPON ARRIVAL:    Level 0  SITE AFTER BAND REMOVAL:    Level 0  REVERSE ALLEN'S TEST:     positive  CIRCULATION SENSATION AND MOVEMENT:    Within Normal Limits   yes  COMMENTS:   1430 Gauze dressing applied secured with tegaderm    1500 Positive reverse allens, csms wnls, gauze dressing dry and intact level 0

## 2012-08-31 NOTE — Interval H&P Note (Signed)
History and Physical Interval Note:  08/31/2012 11:23 AM  [name]  has presented today for cardiac cath with the diagnosis of nstemi/CAD. The various methods of treatment have been discussed with the patient and family. After consideration of risks, benefits and other options for treatment, the patient has consented to  Procedure(s): LEFT HEART CATHETERIZATION WITH CORONARY ANGIOGRAM (N/A) as a surgical intervention .  The patient's history has been reviewed, patient examined, no change in status, stable for surgery.  I have reviewed the patient's chart and labs.  Questions were answered to the patient's satisfaction.     MCALHANY,CHRISTOPHER

## 2012-08-31 NOTE — CV Procedure (Signed)
    Cardiac Catheterization Operative Report  [name] [id number] 5/13/201411:35 AM Harlow Asa, MD  Procedure Performed:  1. Left Heart Catheterization 2. Selective Coronary Angiography  Operator: Verne Carrow, MD  Arterial access site:  Right radial artery.   Indication:  79 yo male with history of CAD with DES right sided PLA in 2010, CHF, DM, HTN, HLD admitted to [facility] with respiratory distress. Diuresed well. Troponin was elevated with peak of 2.45. Transferred to [facility] 08/28/12 for cath. Delayed yesterday due to renal insufficiency post diuresis.                                   Procedure Details: The risks, benefits, complications, treatment options, and expected outcomes were discussed with the patient. The patient and/or family concurred with the proposed plan, giving informed consent. The patient was brought to the cath lab after IV hydration was begun and oral premedication was given. The patient was further sedated with Versed and Fentanyl. The right wrist was assessed with an Allens test which was positive. The right wrist was prepped and draped in a sterile fashion. 1% lidocaine was used for local anesthesia. Using the modified Seldinger access technique, a 5 French sheath was placed in the right radial artery. 3 mg Verapamil was given through the sheath. 4000 units IV heparin was given. Standard diagnostic catheters were used to perform selective coronary angiography. A pigtail catheter was used to perform a left ventricular angiogram. The sheath was removed from the right radial artery and a Terumo hemostasis band was applied at the arteriotomy site on the right wrist.   There were no immediate complications. The patient was taken to the recovery area in stable condition.   Hemodynamic Findings: Central aortic pressure:162/62 Left ventricular pressure: 159/7/12  Angiographic Findings:  Left main: 20% distal stenosis.   Left Anterior Descending Artery:  Large caliber vessel that courses to the apex. The proximal vessel has 20% stenosis. The mid vessel has diffuse 60-70% stenosis. This does not appear to be flow limiting and is grossly unchanged since last cath. The diagonal branch is small in caliber with ostial 40% stenosis.   Circumflex Artery:  Moderate caliber vessel with small caliber intermediate branch, moderate caliber OM branch, moderate caliber posterolateral branch. There is mild plaque in the AV groove Circumflex and OM branch. No flow limiting lesions noted.   Right Coronary Artery: Small to moderate caliber vessel with 30% proximal stenosis, 60% smooth tubular mid stenosis, mild diffuse distal plaque. There is a patent stent in the posterolateral branch with mild 20% diffuse restenosis in the stent.   Left Ventricular Angiogram: Deferred  Impression: 1. Stable moderate non-obstructive CAD  Recommendations: Will continue medical management of CAD. Will keep in the hospital today for post-cath hydration. Recheck BMET in am.        Complications:  None. The patient tolerated the procedure well.

## 2012-08-31 NOTE — Progress Notes (Signed)
 Patient: [name] Date of Encounter: 08/31/2012, 10:20 AM Admit date: 08/27/2012     Subjective  Feels better, less SOB. No CP   Objective   Telemetry: NSR Physical Exam: Filed Vitals:   08/31/12 0809  BP: 177/78  Pulse: 77  Temp:   Resp:    General: Well developed, well nourished, in no acute distress. Head: Normocephalic, atraumatic, sclera non-icteric, no xanthomas, nares are without discharge.  Neck: Positive JVD on R. Lungs: Clear bilaterally to auscultation without wheezes, rales, or rhonchi. Breathing is unlabored. Heart: RRR S1 S2 without murmurs, rubs, or gallops.  Abdomen: Soft, non-tender, non-distended with normoactive bowel sounds. No hepatomegaly. No rebound/guarding. No obvious abdominal masses. Msk:  Strength and tone appear normal for age. Extremities: No edema. Neuro: Alert and oriented X 3. Moves all extremities spontaneously. Psych:  Responds to questions appropriately with a normal affect.    Intake/Output Summary (Last 24 hours) at 08/31/12 1020 Last data filed at 08/31/12 0900  Gross per 24 hour  Intake 1431.75 ml  Output   1000 ml  Net 431.75 ml    Inpatient Medications:  . amLODipine  10 mg Oral Daily  . aspirin EC  81 mg Oral Daily  . atorvastatin  80 mg Oral q1800  . carvedilol  18.75 mg Oral BID WC  . clopidogrel  75 mg Oral Q breakfast  . insulin aspart  0-15 Units Subcutaneous TID WC  . insulin detemir  10 Units Subcutaneous QHS  . levofloxacin  750 mg Oral Q48H  . nitroGLYCERIN  0.5 inch Topical Q6H  . pantoprazole  40 mg Oral Daily  . sodium chloride  3 mL Intravenous Q12H  . tamsulosin  0.4 mg Oral QHS    Labs:  Recent Labs  08/30/12 0340 08/31/12 0424  NA 142 145  K 3.3* 4.1  CL 103 108  CO2 35* 31  GLUCOSE 102* 54*  BUN 33* 25*  CREATININE 1.90* 1.75*  CALCIUM 8.3* 8.4   No results found for this basename: AST, ALT, ALKPHOS, BILITOT, PROT, ALBUMIN,  in the last 72 hours No results found for this basename:  LIPASE, AMYLASE,  in the last 72 hours  Recent Labs  08/30/12 0340 08/31/12 0424  WBC 6.4 7.0  HGB 10.5* 10.6*  HCT 30.8* 31.7*  MCV 80.4 81.7  PLT 125* 128*   No results found for this basename: CKTOTAL, CKMB, TROPONINI,  in the last 72 hours No components found with this basename: POCBNP,  No results found for this basename: DDIMER,  in the last 72 hours No results found for this basename: HGBA1C,  in the last 72 hours No results found for this basename: CHOL, HDL, LDLCALC, TRIG, CHOLHDL,  in the last 72 hours No results found for this basename: TSH, T4TOTAL, FREET3, T3FREE, THYROIDAB,  in the last 72 hours No results found for this basename: VITAMINB12, FOLATE, FERRITIN, TIBC, IRON, RETICCTPCT,  in the last 72 hours  Radiology/Studies:  Dg Chest 2 View  08/27/2012  *RADIOLOGY REPORT*  Clinical Data: Shortness of breath  CHEST - 2 VIEW  Comparison: 08/23/2012  Findings: Cardiomediastinal silhouette is stable.  Central bronchitic changes.  Worsening streaky airspace disease in the right middle lobe suspicious for worsening pneumonia.  Probable small right pleural effusion.  IMPRESSION:  Central bronchitic changes.  Worsening airspace disease right middle lobe suspicious for worsening pneumonia.   Original Report Authenticated By: Liviu Pop, M.D.     Dg Chest Port 1 View  08/28/2012  *RADIOLOGY   REPORT*  Clinical Data: Acute respiratory failure.  Heart failure.  Renal failure.  Diabetes.  Hypertension.  PORTABLE CHEST - 1 VIEW  Comparison: 08/27/2012  Findings: The patient is rotated to the right on today's exam, resulting in reduced diagnostic sensitivity and specificity. Cardiothoracic index 57%, compatible with mild cardiomegaly. Improved aeration noted at the lung bases.  No pleural effusion observed.  IMPRESSION:  1.  Significantly improved aeration of the lung bases. 2.  Mild cardiomegaly.   Original Report Authenticated By: Walter Liebkemann, M.D.          Assessment and Plan  1  Acute respiratory failure   - Clinical status improved on IV Lasix  2 CAP   - On PO Levaquin, currently 3 NST EMI/CAD   - peak Tn 2.5  - DES RCA, 10/2008   - EF 60%  3 CKD, creatinine down to 1.75 today 4 DM  5 HTN  6 HLD  PLAN: Proceed with diagnostic cath (no LV gram) today.2D echo shows normal LV systolic function.   Signed, Oluwafemi Villella MD   

## 2012-08-31 NOTE — Progress Notes (Signed)
5:44 AM   Heparin per Rx  Heparin level has increased to 0.79 IU.ml this am with no evidence of bleeding noted. Possibly a cath lab add on for today. Decreasing heparin to 900 units/hr with 8 hour HL for follow up as renal fxn is reassessed in anticipation of intervention.   Janice Coffin

## 2012-08-31 NOTE — H&P (View-Only) (Signed)
Patient: [name] Date of Encounter: 08/31/2012, 10:20 AM Admit date: 08/27/2012     Subjective  Feels better, less SOB. No CP   Objective   Telemetry: NSR Physical Exam: Filed Vitals:   08/31/12 0809  BP: 177/78  Pulse: 77  Temp:   Resp:    General: Well developed, well nourished, in no acute distress. Head: Normocephalic, atraumatic, sclera non-icteric, no xanthomas, nares are without discharge.  Neck: Positive JVD on R. Lungs: Clear bilaterally to auscultation without wheezes, rales, or rhonchi. Breathing is unlabored. Heart: RRR S1 S2 without murmurs, rubs, or gallops.  Abdomen: Soft, non-tender, non-distended with normoactive bowel sounds. No hepatomegaly. No rebound/guarding. No obvious abdominal masses. Msk:  Strength and tone appear normal for age. Extremities: No edema. Neuro: Alert and oriented X 3. Moves all extremities spontaneously. Psych:  Responds to questions appropriately with a normal affect.    Intake/Output Summary (Last 24 hours) at 08/31/12 1020 Last data filed at 08/31/12 0900  Gross per 24 hour  Intake 1431.75 ml  Output   1000 ml  Net 431.75 ml    Inpatient Medications:  . amLODipine  10 mg Oral Daily  . aspirin EC  81 mg Oral Daily  . atorvastatin  80 mg Oral q1800  . carvedilol  18.75 mg Oral BID WC  . clopidogrel  75 mg Oral Q breakfast  . insulin aspart  0-15 Units Subcutaneous TID WC  . insulin detemir  10 Units Subcutaneous QHS  . levofloxacin  750 mg Oral Q48H  . nitroGLYCERIN  0.5 inch Topical Q6H  . pantoprazole  40 mg Oral Daily  . sodium chloride  3 mL Intravenous Q12H  . tamsulosin  0.4 mg Oral QHS    Labs:  Recent Labs  08/30/12 0340 08/31/12 0424  NA 142 145  K 3.3* 4.1  CL 103 108  CO2 35* 31  GLUCOSE 102* 54*  BUN 33* 25*  CREATININE 1.90* 1.75*  CALCIUM 8.3* 8.4   No results found for this basename: AST, ALT, ALKPHOS, BILITOT, PROT, ALBUMIN,  in the last 72 hours No results found for this basename:  LIPASE, AMYLASE,  in the last 72 hours  Recent Labs  08/30/12 0340 08/31/12 0424  WBC 6.4 7.0  HGB 10.5* 10.6*  HCT 30.8* 31.7*  MCV 80.4 81.7  PLT 125* 128*   No results found for this basename: CKTOTAL, CKMB, TROPONINI,  in the last 72 hours No components found with this basename: POCBNP,  No results found for this basename: DDIMER,  in the last 72 hours No results found for this basename: HGBA1C,  in the last 72 hours No results found for this basename: CHOL, HDL, LDLCALC, TRIG, CHOLHDL,  in the last 72 hours No results found for this basename: TSH, T4TOTAL, FREET3, T3FREE, THYROIDAB,  in the last 72 hours No results found for this basename: VITAMINB12, FOLATE, FERRITIN, TIBC, IRON, RETICCTPCT,  in the last 72 hours  Radiology/Studies:  Dg Chest 2 View  08/27/2012  *RADIOLOGY REPORT*  Clinical Data: Shortness of breath  CHEST - 2 VIEW  Comparison: 08/23/2012  Findings: Cardiomediastinal silhouette is stable.  Central bronchitic changes.  Worsening streaky airspace disease in the right middle lobe suspicious for worsening pneumonia.  Probable small right pleural effusion.  IMPRESSION:  Central bronchitic changes.  Worsening airspace disease right middle lobe suspicious for worsening pneumonia.   Original Report Authenticated By: Natasha Mead, M.D.     Dg Chest Port 1 View  08/28/2012  *RADIOLOGY  REPORT*  Clinical Data: Acute respiratory failure.  Heart failure.  Renal failure.  Diabetes.  Hypertension.  PORTABLE CHEST - 1 VIEW  Comparison: 08/27/2012  Findings: The patient is rotated to the right on today's exam, resulting in reduced diagnostic sensitivity and specificity. Cardiothoracic index 57%, compatible with mild cardiomegaly. Improved aeration noted at the lung bases.  No pleural effusion observed.  IMPRESSION:  1.  Significantly improved aeration of the lung bases. 2.  Mild cardiomegaly.   Original Report Authenticated By: Gaylyn Rong, M.D.          Assessment and Plan  1  Acute respiratory failure   - Clinical status improved on IV Lasix  2 CAP   - On PO Levaquin, currently 3 NST EMI/CAD   - peak Tn 2.5  - DES RCA, 10/2008   - EF 60%  3 CKD, creatinine down to 1.75 today 4 DM  5 HTN  6 HLD  PLAN: Proceed with diagnostic cath (no LV gram) today.2D echo shows normal LV systolic function.   Signed, Cassell Clement MD

## 2012-08-31 NOTE — Progress Notes (Signed)
Utilization Review Completed Desteni Piscopo J. Teale Goodgame, RN, BSN, NCM [phone]  

## 2012-09-01 ENCOUNTER — Encounter (HOSPITAL_COMMUNITY): Payer: Self-pay | Admitting: Nurse Practitioner

## 2012-09-01 ENCOUNTER — Telehealth: Payer: Self-pay | Admitting: *Deleted

## 2012-09-01 DIAGNOSIS — I5031 Acute diastolic (congestive) heart failure: Secondary | ICD-10-CM

## 2012-09-01 DIAGNOSIS — I509 Heart failure, unspecified: Secondary | ICD-10-CM

## 2012-09-01 LAB — BASIC METABOLIC PANEL
BUN: 22 mg/dL (ref 6–23)
GFR calc Af Amer: 48 mL/min — ABNORMAL LOW (ref >90)
GFR calc non Af Amer: 42 mL/min — ABNORMAL LOW (ref >90)
Potassium: 3.5 mEq/L (ref 3.5–5.1)
Sodium: 146 mEq/L — ABNORMAL HIGH (ref 135–145)

## 2012-09-01 LAB — CBC
HCT: 31.4 % — ABNORMAL LOW (ref 39.0–52.0)
MCHC: 34.1 g/dL (ref 30.0–36.0)
Platelets: 125 10*3/uL — ABNORMAL LOW (ref 150–400)
RDW: 13.9 % (ref 11.5–15.5)

## 2012-09-01 LAB — CULTURE, BLOOD (ROUTINE X 2)
Culture: NO GROWTH
Culture: NO GROWTH

## 2012-09-01 MED ORDER — METFORMIN HCL 500 MG PO TABS: 1000.0000 mg | ORAL_TABLET | Freq: Two times a day (BID) | ORAL | Status: AC

## 2012-09-01 MED ORDER — ISOSORBIDE MONONITRATE ER 30 MG PO TB24
30.0000 mg | ORAL_TABLET | Freq: Every day | ORAL | Status: DC
  Administered 2012-09-01: 09:00:00 30 mg via ORAL
  Filled 2012-09-01: qty 1

## 2012-09-01 MED ORDER — ISOSORBIDE MONONITRATE ER 30 MG PO TB24: 30.0000 mg | ORAL_TABLET | Freq: Every day | ORAL | Status: AC

## 2012-09-01 MED ORDER — NITROGLYCERIN 0.4 MG SL SUBL: 0.4000 mg | SUBLINGUAL_TABLET | SUBLINGUAL | Status: AC | PRN

## 2012-09-01 NOTE — Telephone Encounter (Signed)
7 day transition of care/ pt has not been discharged yet. Will be later today

## 2012-09-01 NOTE — Progress Notes (Signed)
Subjective:  Patient doing well post cath. No chest pain.  Rhythm is NSR.  Objective:  Vital Signs in the last 24 hours: Temp:  [97.4 F (36.3 C)-98.7 F (37.1 C)] 97.8 F (36.6 C) (05/14 0448) Pulse Rate:  [66-88] 79 (05/14 0448) Resp:  [22-30] 22 (05/13 1221) BP: (127-199)/(48-116) 164/79 mmHg (05/14 0448) SpO2:  [94 %-100 %] 94 % (05/14 0448) Weight:  [167 lb 1.7 oz (75.8 kg)] 167 lb 1.7 oz (75.8 kg) (05/14 0500)  Intake/Output from previous day: 05/13 0701 - 05/14 0700 In: 1037 [P.O.:360; I.V.:677] Out: 675 [Urine:675] Intake/Output from this shift:    . amLODipine  10 mg Oral Daily  . aspirin EC  81 mg Oral Daily  . atorvastatin  80 mg Oral q1800  . carvedilol  18.75 mg Oral BID WC  . clopidogrel  75 mg Oral Q breakfast  . insulin aspart  0-15 Units Subcutaneous TID WC  . insulin detemir  10 Units Subcutaneous QHS  . isosorbide mononitrate  30 mg Oral Daily  . levofloxacin  750 mg Oral Q48H  . pantoprazole  40 mg Oral Daily  . sodium chloride  3 mL Intravenous Q12H  . tamsulosin  0.4 mg Oral QHS      Physical Exam: The patient appears to be in no distress.  Head and neck exam reveals that the pupils are equal and reactive.  The extraocular movements are full.  There is no scleral icterus.  Mouth and pharynx are benign.  No lymphadenopathy.  No carotid bruits.  The jugular venous pressure is normal.  Thyroid is not enlarged or tender.  Chest is clear to percussion and auscultation.  No rales or rhonchi.  Expansion of the chest is symmetrical.  Heart reveals no abnormal lift or heave.  First and second heart sounds are normal.  There is no murmur gallop rub or click.  The abdomen is soft and nontender.  Bowel sounds are normoactive.  There is no hepatosplenomegaly or mass.  There are no abdominal bruits.  Extremities reveal no phlebitis or edema.  Pedal pulses are good.  There is no cyanosis or clubbing.  Neurologic exam is normal strength and no lateralizing  weakness.  No sensory deficits.  Integument reveals no rash  Lab Results:  Recent Labs  08/31/12 0424 09/01/12 0440  WBC 7.0 6.2  HGB 10.6* 10.7*  PLT 128* 125*    Recent Labs  08/31/12 0424 09/01/12 0440  NA 145 146*  K 4.1 3.5  CL 108 109  CO2 31 29  GLUCOSE 54* 109*  BUN 25* 22  CREATININE 1.75* 1.54*   No results found for this basename: TROPONINI, CK, MB,  in the last 72 hours Hepatic Function Panel No results found for this basename: PROT, ALBUMIN, AST, ALT, ALKPHOS, BILITOT, BILIDIR, IBILI,  in the last 72 hours No results found for this basename: CHOL,  in the last 72 hours No results found for this basename: PROTIME,  in the last 72 hours  Imaging: Imaging results have been reviewed  Cardiac Studies: Telemetry reviewed. NSR. Assessment/Plan:  1 Acute respiratory failure  - Clinical status improved  2 CAP  - On PO Levaquin, xray now normal. 3 NSTEMI - peak Tn 2.5  - DES RCA, 10/2008    Cath yesterday satisfactory. Stent patent. No PCI needed. 3 CKD, creatinine down to 1.54 4 DM  5 HTN  6 HLD  Plan:  Okay for discharge today. Does not need Levaquin at home.  Ok  to restart lisinoprilHCTZ and metformin on discharge.  Followup with Dr. Macarthur Critchley in about a week for OV and BMET. Continue ASA and Plavix.  LOS: 5 days    [name] 09/01/2012, 7:31 AM

## 2012-09-01 NOTE — Discharge Summary (Signed)
Patient ID: [name],  MRN: [id number], DOB/AGE: 1933-10-20 79 y.o.  Admit date: 08/27/2012 Discharge date: 09/01/2012  Primary Care Provider: Harlow Asa Primary Cardiologist: R. Dietrich Pates, MD  Discharge Diagnoses Principal Problem:   Acute respiratory failure  **Multifactorial in the setting of PNA, CHF, and NSTEMI  Active Problems:   NSTEMI (non-ST elevated myocardial infarction)    **s/p cath this admission revealing non-obstructive CAD and patent RPL stent.   ATHEROSCLEROTIC CARDIOVASCULAR DISEASE  **s/p prior stenting of the RPL   Community acquired pneumonia  **completed 7 day course of levaquin   Acute diastolic CHF (congestive heart failure)  **discharge weight of 167 lbs.   DIABETES MELLITUS, TYPE II   Acute on Chronic Stage III Kidney Disease  **in setting of diuresis   HYPERLIPIDEMIA   HYPERTENSION  Allergies No Known Allergies  Procedures  2D Echocardiogram   Study Conclusions  - Left ventricle: The cavity size was normal. There was mild   to moderate concentric hypertrophy. Systolic function was   normal. The estimated ejection fraction was in the range   of 50% to 55%. Wall motion was normal; there were no   regional wall motion abnormalities. - Aortic valve: Mildly calcified annulus. Trileaflet. - Left atrium: The atrium was mildly dilated. - Atrial septum: The septum bowed from left to right,   consistent with increased left atrial pressure. No defect   or patent foramen ovale was identified. - Pulmonary arteries: PA peak pressure: 43mm Hg (S). _____________  Cardiac Catheterization 5.13.2014  Hemodynamic Findings: Central aortic pressure:162/62 Left ventricular pressure: 159/7/12  Angiographic Findings:  Left main: 20% distal stenosis.   Left Anterior Descending Artery: Large caliber vessel that courses to the apex. The proximal vessel has 20% stenosis. The mid vessel has diffuse 60-70% stenosis. This does not appear to be flow limiting  and is grossly unchanged since last cath. The diagonal branch is small in caliber with ostial 40% stenosis.   Circumflex Artery:  Moderate caliber vessel with small caliber intermediate branch, moderate caliber OM branch, moderate caliber posterolateral branch. There is mild plaque in the AV groove Circumflex and OM branch. No flow limiting lesions noted.   Right Coronary Artery: Small to moderate caliber vessel with 30% proximal stenosis, 60% smooth tubular mid stenosis, mild diffuse distal plaque. There is a patent stent in the posterolateral branch with mild 20% diffuse restenosis in the stent.  Left Ventricular Angiogram: Deferred  Impression: 1. Stable moderate non-obstructive CAD _____________  History of Present Illness  79 y/o male with h/o throat CA s/p radiation, CAD, HTN, HL, DM, CVA and CKD, who was in his usual state of health until 08/26/2012 when he began to experience progressive dyspnea with a productive cough associated with orthopnea.  He presented to the [facility] ED on 5/9 and was found to be tachypneic and hypoxic with oxygen saturation in the mid-80's.  CXR suggested RML pneumonia, while troponin was elevated @ 1.45 and pBNP was elevated @ 6185.  He was admitted to [facility] for further evaluation and treatment.  He was placed on bipap and diuresed briskly.  IV antibiotics were also initiated.  He eventually peaked his troponin at 2.5, and decision was made to transfer him to [facility] for further cardiac work-up.  Hospital Course  Following arrival, patient had no chest pain.  IV diuresis was maintained however patient bumped his creatinine from 1.45 on admission to a peak of 1.95.  Volume status improved, lasix was discontinued, and he was  gently hydrated in preparation for diagnostic catheterization in the setting of NSTEMI.  With hydration, creatinine improved and he underwent diagnostic catheterization on 5/13, revealing non-obstructive CAD and a patent stent in  the RPL, with minimal in-stent restenosis.  Continued medical therapy was warranted.  He has completed a 7 day course of antibiotics.  His volume status has remained stable and he has had no further dyspnea.  Echocardiogram performed on 5/11 showed normal LV function.  We plan to discharged [name] home today in good condition and have arranged for early follow-up in our [place] office next week, at which time, we will recheck a basic metabolic profile.  Discharge Vitals Blood pressure 160/116, pulse 82, temperature 97.7 F (36.5 C), temperature source Oral, resp. rate 22, height 5\' 11"  (1.803 m), weight 167 lb 1.7 oz (75.8 kg), SpO2 94.00%.  Filed Weights   08/31/12 0341 09/01/12 0014 09/01/12 0500  Weight: 166 lb 7.2 oz (75.5 kg) 167 lb 1.7 oz (75.8 kg) 167 lb 1.7 oz (75.8 kg)   Labs  CBC  Recent Labs  08/31/12 0424 09/01/12 0440  WBC 7.0 6.2  HGB 10.6* 10.7*  HCT 31.7* 31.4*  MCV 81.7 80.9  PLT 128* 125*   Basic Metabolic Panel  Recent Labs  08/31/12 0424 09/01/12 0440  NA 145 146*  K 4.1 3.5  CL 108 109  CO2 31 29  GLUCOSE 54* 109*  BUN 25* 22  CREATININE 1.75* 1.54*  CALCIUM 8.4 8.6   Liver Function Tests Lab Results  Component Value Date   ALT 12 08/27/2012   AST 24 08/27/2012   ALKPHOS 52 08/27/2012   BILITOT 0.5 08/27/2012   Cardiac Enzymes Lab Results  Component Value Date   TROPONINI 2.38* 08/28/2012   Disposition  Pt is being discharged home today in good condition.  Follow-up Plans & Appointments  Follow-up Information   Follow up with Jacolyn Reedy, PA-C On 09/08/2012. (1:20 PM - Dr. Marvel Plan PA.  We will check a blood chemistry that day.)    Contact information:   [address] [place] [address] [phone]       Follow up with Harlow Asa, MD. (as scheduled)    Contact information:   [address] [place] [address] [phone]      Discharge Medications    Medication List    TAKE these medications        amLODipine 10 MG tablet  Commonly known as:  NORVASC  Take 1 tablet (10 mg total) by mouth daily.     aspirin 81 MG EC tablet  Take 81 mg by mouth daily.     atorvastatin 80 MG tablet  Commonly known as:  LIPITOR  Take 1 tablet (80 mg total) by mouth daily.     carvedilol 25 MG tablet  Commonly known as:  COREG  Take 1 tablet (25 mg total) by mouth 2 (two) times daily with a meal.     clopidogrel 75 MG tablet  Commonly known as:  PLAVIX  Take 1 tablet (75 mg total) by mouth daily. Pt. Is due for OV and needs to schedule     glipiZIDE 10 MG tablet  Commonly known as:  GLUCOTROL  Take 10 mg by mouth 2 (two) times daily before a meal.     hydrochlorothiazide 25 MG tablet  Commonly known as:  HYDRODIURIL  Take 1 tablet (25 mg total) by mouth daily.     isosorbide mononitrate 30 MG 24 hr  tablet  Commonly known as:  IMDUR  Take 1 tablet (30 mg total) by mouth daily.     lisinopril 40 MG tablet  Commonly known as:  PRINIVIL,ZESTRIL  Take 1 tablet (40 mg total) by mouth daily.     metFORMIN 500 MG tablet - To be resumed on 5/15.  Commonly known as:  GLUCOPHAGE  Take 2 tablets (1,000 mg total) by mouth 2 (two) times daily with a meal.     nitroGLYCERIN 0.4 MG SL tablet  Commonly known as:  NITROSTAT  Place 1 tablet (0.4 mg total) under the tongue every 5 (five) minutes as needed for chest pain.     pantoprazole 20 MG tablet  Commonly known as:  PROTONIX  Take 1 tablet (20 mg total) by mouth daily.     tamsulosin 0.4 MG Caps  Commonly known as:  FLOMAX  Take 1 capsule (0.4 mg total) by mouth at bedtime.        Outstanding Labs/Studies  bmet scheduled for 5/21.  Duration of Discharge Encounter   Greater than 30 minutes including physician time.  Signed, Nicolasa Ducking NP 09/01/2012, 9:34 AM

## 2012-09-03 NOTE — Telephone Encounter (Signed)
Patient contacted regarding discharge from [facility] on 09-01-12.  Patient understands to follow up with provider ML NP on 09-08-12 at 1:20PM at [facility] OFFICE [facility]. Patient understands discharge instructions? YES Patient understands medications and regiment? YES Patient understands to bring all medications to this visit? YES

## 2012-09-06 ENCOUNTER — Ambulatory Visit (INDEPENDENT_AMBULATORY_CARE_PROVIDER_SITE_OTHER): Payer: Medicare Other | Admitting: Family Medicine

## 2012-09-06 ENCOUNTER — Encounter: Payer: Self-pay | Admitting: Family Medicine

## 2012-09-06 VITALS — BP 160/72 | HR 70 | Wt 173.0 lb

## 2012-09-06 DIAGNOSIS — N289 Disorder of kidney and ureter, unspecified: Secondary | ICD-10-CM

## 2012-09-06 DIAGNOSIS — J189 Pneumonia, unspecified organism: Secondary | ICD-10-CM

## 2012-09-06 NOTE — Progress Notes (Signed)
  Subjective:    Patient ID: [name], male    DOB: October 01, 1933, 79 y.o.   MRN: [id number]  Pneumonia  Patient in office today for a follow up from hospital admission for pneumonia and heart damage. Daughter wants to get referral for home health nurse. This patient was in the hospital. The head and pneumonia had respiratory failure along with significant renal insufficiency. Also has diabetes. Hospital records were reviewed while the patient was present. Plus also discussed with the daughter what has been going on with him. Patient also had heart damage due to the stress of his illness but he did not put any knee stents in. Past medical history heart disease peripheral vascular disease hypertension, Kidney disease, acute renal parenchyma, hyperlipidemia, diabetes Family history noncontributory Social doesn't smoke   Review of Systems He denies any chest tightness pressure pain abdominal pain headaches nausea vomiting diarrhea dysuria bloody stools.    Objective:   Physical Exam Blood pressure mildly elevated on recheck 148/72 lungs are clear no crackles heart rate is controlled abdomen soft extremities no edema skin warm dry pulse normal       Assessment & Plan:  #1 pneumonia-resolved. Hospital records reviewed #2 renal insufficiency recheck metabolic 7 is hard to know if he is taking his medicines properly he did not bring them in today I encouraged that his stepdaughter to bring all his medicines with him on the next visit plus also when she sees Dr. Marvel Plan office.  May need to come off of metformin. Await metabolic 7 #3 I do believe patient would benefit from home health evaluation for possible nurse aide to help him with medications and keeping them straight not sure if his insurance will approve

## 2012-09-07 LAB — BASIC METABOLIC PANEL
BUN: 17 mg/dL (ref 6–23)
Creat: 1.27 mg/dL (ref 0.50–1.35)
Potassium: 3.8 mEq/L (ref 3.5–5.3)

## 2012-09-08 ENCOUNTER — Encounter: Payer: Self-pay | Admitting: Family Medicine

## 2012-09-08 ENCOUNTER — Ambulatory Visit (INDEPENDENT_AMBULATORY_CARE_PROVIDER_SITE_OTHER): Payer: Medicare Other | Admitting: Physician Assistant

## 2012-09-08 VITALS — BP 178/80 | HR 81 | Ht 71.0 in | Wt 172.0 lb

## 2012-09-08 DIAGNOSIS — I2510 Atherosclerotic heart disease of native coronary artery without angina pectoris: Secondary | ICD-10-CM

## 2012-09-08 DIAGNOSIS — I509 Heart failure, unspecified: Secondary | ICD-10-CM

## 2012-09-08 DIAGNOSIS — I10 Essential (primary) hypertension: Secondary | ICD-10-CM

## 2012-09-08 DIAGNOSIS — I5031 Acute diastolic (congestive) heart failure: Secondary | ICD-10-CM

## 2012-09-08 DIAGNOSIS — I214 Non-ST elevation (NSTEMI) myocardial infarction: Secondary | ICD-10-CM

## 2012-09-08 NOTE — Assessment & Plan Note (Signed)
Blood pressures been up over the past 3 days but was stable before this. He has gotten a lot of salt in his diet. We have given him a 2 g sodium diet to follow. His stepdaughter will continue to monitor his blood pressures at home. If they remain elevated he may need clonidine added to his medications.

## 2012-09-08 NOTE — Patient Instructions (Addendum)
Your physician recommends that you schedule a follow-up appointment in: ONE MONTH WITH RR  Your physician has requested that you regularly monitor and record your blood pressure readings at home. Please use the same machine at the same time of day to check your readings and record them to bring to your follow-up visit.  Your physician recommends THAT YOU START A LOW SODIUM DIET AS FOLLOWS:  2 Gram Low Sodium Diet A 2 gram sodium diet restricts the amount of sodium in the diet to no more than 2 g or 2000 mg daily. Limiting the amount of sodium is often used to help lower blood pressure. It is important if you have heart, liver, or kidney problems. Many foods contain sodium for flavor and sometimes as a preservative. When the amount of sodium in a diet needs to be low, it is important to know what to look for when choosing foods and drinks. The following includes some information and guidelines to help make it easier for you to adapt to a low sodium diet. QUICK TIPS  Do not add salt to food.  Avoid convenience items and fast food.  Choose unsalted snack foods.  Buy lower sodium products, often labeled as "lower sodium" or "no salt added."  Check food labels to learn how much sodium is in 1 serving.  When eating at a restaurant, ask that your food be prepared with less salt or none, if possible. READING FOOD LABELS FOR SODIUM INFORMATION The nutrition facts label is a good place to find how much sodium is in foods. Look for products with no more than 500 to 600 mg of sodium per meal and no more than 150 mg per serving. Remember that 2 g = 2000 mg. The food label may also list foods as:  Sodium-free: Less than 5 mg in a serving.  Very low sodium: 35 mg or less in a serving.  Low-sodium: 140 mg or less in a serving.  Light in sodium: 50% less sodium in a serving. For example, if a food that usually has 300 mg of sodium is changed to become light in sodium, it will have 150 mg of  sodium.  Reduced sodium: 25% less sodium in a serving. For example, if a food that usually has 400 mg of sodium is changed to reduced sodium, it will have 300 mg of sodium. CHOOSING FOODS Grains  Avoid: Salted crackers and snack items. Some cereals, including instant hot cereals. Bread stuffing and biscuit mixes. Seasoned rice or pasta mixes.  Choose: Unsalted snack items. Low-sodium cereals, oats, puffed wheat and rice, shredded wheat. English muffins and bread. Pasta. Meats  Avoid: Salted, canned, smoked, spiced, pickled meats, including fish and poultry. Bacon, ham, sausage, cold cuts, hot dogs, anchovies.  Choose: Low-sodium canned tuna and salmon. Fresh or frozen meat, poultry, and fish. Dairy  Avoid: Processed cheese and spreads. Cottage cheese. Buttermilk and condensed milk. Regular cheese.  Choose: Milk. Low-sodium cottage cheese. Yogurt. Sour cream. Low-sodium cheese. Fruits and Vegetables  Avoid: Regular canned vegetables. Regular canned tomato sauce and paste. Frozen vegetables in sauces. Olives. Rosita Fire. Relishes. Sauerkraut.  Choose: Low-sodium canned vegetables. Low-sodium tomato sauce and paste. Frozen or fresh vegetables. Fresh and frozen fruit. Condiments  Avoid: Canned and packaged gravies. Worcestershire sauce. Tartar sauce. Barbecue sauce. Soy sauce. Steak sauce. Ketchup. Onion, garlic, and table salt. Meat flavorings and tenderizers.  Choose: Fresh and dried herbs and spices. Low-sodium varieties of mustard and ketchup. Lemon juice. Tabasco sauce. Horseradish. SAMPLE 2 GRAM  SODIUM MEAL PLAN Breakfast / Sodium (mg)  1 cup low-fat milk / 143 mg  2 slices whole-wheat toast / 270 mg  1 tbs heart-healthy margarine / 153 mg  1 hard-boiled egg / 139 mg  1 small orange / 0 mg Lunch / Sodium (mg)  1 cup raw carrots / 76 mg   cup hummus / 298 mg  1 cup low-fat milk / 143 mg   cup red grapes / 2 mg  1 whole-wheat pita bread / 356 mg Dinner / Sodium  (mg)  1 cup whole-wheat pasta / 2 mg  1 cup low-sodium tomato sauce / 73 mg  3 oz lean ground beef / 57 mg  1 small side salad (1 cup raw spinach leaves,  cup cucumber,  cup yellow bell pepper) with 1 tsp olive oil and 1 tsp red wine vinegar / 25 mg Snack / Sodium (mg)  1 container low-fat vanilla yogurt / 107 mg  3 graham cracker squares / 127 mg Nutrient Analysis  Calories: 2033  Protein: 77 g  Carbohydrate: 282 g  Fat: 72 g  Sodium: 1971 mg Document Released: 04/07/2005 Document Revised: 06/30/2011 Document Reviewed: 07/09/2009 [employer] Patient Information 2014 [place], [place].

## 2012-09-08 NOTE — Assessment & Plan Note (Signed)
Followup creatinine was down to 1.27 which is improved from 1.54.

## 2012-09-08 NOTE — Progress Notes (Signed)
HPI:   A 79 year old African American male patient who was admitted to the hospital with acute respiratory failure felt secondary to pneumonia, heart failure, and non-ST elevation MI. Cardiac catheterization revealed nonobstructive coronary artery disease with 60-70% mid LAD unchanged from prior catheter, patent stent in the PLA with mild 20% restenosis in the stent. Medical therapy recommended. 2-D echo showed normal LV function ejection fraction 50-55%. The patient did develop some renal insufficiency on IV diuretics and had to be gently hydrated. The neck yesterday showed improvement in his creatinine from 1.54 down to 1.27.  The patient has done well since he's been home without chest pain, palpitations, dyspnea, dyspnea on exertion, dizziness, or presyncope. He is accompanied today by his stepdaughter who checks his blood pressure daily. His blood pressures have been elevated to about 168/80 for the past 3 days. On further questioning the patient has been getting a lot of salt in the past week. Prior to this his blood pressures were normal. He is eating out a lot at a chicken place and needs a lot of canned foods.  No Known Allergies  Current Outpatient Prescriptions on File Prior to Visit: amLODipine (NORVASC) 10 MG tablet, Take 1 tablet (10 mg total) by mouth daily., Disp: 30 tablet, Rfl: 11 aspirin 81 MG EC tablet, Take 81 mg by mouth daily.  , Disp: , Rfl:  atorvastatin (LIPITOR) 80 MG tablet, Take 1 tablet (80 mg total) by mouth daily., Disp: 30 tablet, Rfl: 11 carvedilol (COREG) 25 MG tablet, Take 1 tablet (25 mg total) by mouth 2 (two) times daily with a meal., Disp: 60 tablet, Rfl: 11 clopidogrel (PLAVIX) 75 MG tablet, Take 1 tablet (75 mg total) by mouth daily. Pt. Is due for OV and needs to schedule, Disp: 30 tablet, Rfl: 11 glipiZIDE (GLUCOTROL) 10 MG tablet, Take 10 mg by mouth 2 (two) times daily before a meal.  , Disp: , Rfl:  hydrochlorothiazide (HYDRODIURIL) 25 MG tablet, Take 1  tablet (25 mg total) by mouth daily., Disp: 30 tablet, Rfl: 11 isosorbide mononitrate (IMDUR) 30 MG 24 hr tablet, Take 1 tablet (30 mg total) by mouth daily., Disp: 30 tablet, Rfl: 6 lisinopril (PRINIVIL,ZESTRIL) 40 MG tablet, Take 1 tablet (40 mg total) by mouth daily., Disp: 30 tablet, Rfl: 11 metFORMIN (GLUCOPHAGE) 500 MG tablet, Take 2 tablets (1,000 mg total) by mouth 2 (two) times daily with a meal., Disp: , Rfl:  nitroGLYCERIN (NITROSTAT) 0.4 MG SL tablet, Place 1 tablet (0.4 mg total) under the tongue every 5 (five) minutes as needed for chest pain., Disp: 25 tablet, Rfl: 3 pantoprazole (PROTONIX) 20 MG tablet, Take 1 tablet (20 mg total) by mouth daily., Disp: 30 tablet, Rfl: 0 tamsulosin (FLOMAX) 0.4 MG CAPS, Take 1 capsule (0.4 mg total) by mouth at bedtime., Disp: 30 capsule, Rfl: 11  No current facility-administered medications on file prior to visit.   Past Medical History:   Hypertension                                                 Diabetes mellitus without complication                         Comment:Type 2   Hyperlipidemia  CAD (coronary artery disease)                                  Comment:a. 10/2008 Cath/PCI: 2.5 x 15-mm XIENCE DES to               the dist RCA/RPL;  b. 08/2012 NSTEMI/Cath: LM               20, LAD 20p, 60-61m, D1 40ost, LCX min irregs,               RCA 30p, 42m, PL 20 isr.   PAD (peripheral artery disease)                              Throat cancer                                                Chronic diastolic CHF (congestive heart failur*                Comment:a. 08/2012 Echo: EF 50-55%, mildly dil LA, PASP               .   CKD (chronic kidney disease), stage III                     Past Surgical History:   THROAT SURGERY                                               Review of patient's family history indicates:   Diabetes                       Mother                   Heart attack                    Brother                  Social History   Marital Status: Widowed             Spouse Name:                      Years of Education:                 Number of children:             Occupational History   None on file  Social History Main Topics   Smoking Status: Former Smoker                   Packs/Day: 0.00  Years:         Smokeless Status: Not on file                      Alcohol Use: No             Drug Use: No             Sexual Activity: Not on file  Other Topics            Concern   None on file  Social History Narrative   None on file    ROS:see history of present illness otherwise negative   PHYSICAL EXAM: Well-nournished, in no acute distress, missing teeth, hoarse. Neck: No JVD, HJR, Bruit, or thyroid enlargement  Lungs: decreased breath sounds with crackles at the left base  Cardiovascular: RRR, PMI not displaced, positive S4 and 1-2/6 systolic murmur at the left sternal border, no bruit, thrill, or heave.  Abdomen: BS normal. Soft without organomegaly, masses, lesions or tenderness.  Extremities: right arm and catheter site without hematoma or hemorrhage, good radial and brachial pulse, lower extremities without cyanosis, clubbing or edema. Good distal pulses bilateral  SKin: Warm, no lesions or rashes   Musculoskeletal: No deformities  Neuro: no focal signs  BP 178/80  Pulse 81  Ht 5\' 11"  (1.803 m)  Wt 172 lb (78.019 kg)  BMI 24 kg/m2   ZOX:WRUEAV sinus rhythm with left anterior fascicular block, LVH, nonspecific ST-T wave changes  2D Echocardiogram   Study Conclusions  - Left ventricle: The cavity size was normal. There was mild   to moderate concentric hypertrophy. Systolic function was   normal. The estimated ejection fraction was in the range   of 50% to 55%. Wall motion was normal; there were no   regional wall motion abnormalities. - Aortic valve: Mildly calcified annulus. Trileaflet. - Left atrium: The atrium was  mildly dilated. - Atrial septum: The septum bowed from left to right,   consistent with increased left atrial pressure. No defect   or patent foramen ovale was identified. - Pulmonary arteries: PA peak pressure: 43mm Hg (S). _____________  Cardiac Catheterization 5.13.2014  Hemodynamic Findings: Central aortic pressure:162/62 Left ventricular pressure: 159/7/12  Angiographic Findings:  Left main: 20% distal stenosis.   Left Anterior Descending Artery: Large caliber vessel that courses to the apex. The proximal vessel has 20% stenosis. The mid vessel has diffuse 60-70% stenosis. This does not appear to be flow limiting and is grossly unchanged since last cath. The diagonal branch is small in caliber with ostial 40% stenosis.   Circumflex Artery:  Moderate caliber vessel with small caliber intermediate branch, moderate caliber OM branch, moderate caliber posterolateral branch. There is mild plaque in the AV groove Circumflex and OM branch. No flow limiting lesions noted.   Right Coronary Artery: Small to moderate caliber vessel with 30% proximal stenosis, 60% smooth tubular mid stenosis, mild diffuse distal plaque. There is a patent stent in the posterolateral branch with mild 20% diffuse restenosis in the stent.   Left Ventricular Angiogram: Deferred  Impression: 1. Stable moderate non-obstructive CAD _____________

## 2012-09-08 NOTE — Assessment & Plan Note (Signed)
Patient had a recent admission with respiratory failure felt to be multifactorial including pneumonia, CHF, nonST elevation MI. Cardiac catheter showed nonobstructive disease as listed above with patent stent. Continue medical therapy.

## 2012-09-10 ENCOUNTER — Telehealth: Payer: Self-pay | Admitting: *Deleted

## 2012-09-10 NOTE — Telephone Encounter (Signed)
Pt step daughter called to state pt Bp is elevated again and was advised by ML on recent OV 09-08-12 if the elevations continue to call office to have medication added, notations in chart state addition of clonidine, however no specific dosage noted, please advise dosage to send pt, pt aware we will call with answer asap, however office is closed on Monday, pt understood

## 2012-09-15 NOTE — Telephone Encounter (Signed)
Please ask for specific bp readings from patient.

## 2012-09-15 NOTE — Telephone Encounter (Signed)
Called pt step daughter to clarify, she noted the readings had been really high on 09-09-12 214/98, 09-11-12 110/91,09-12-12 177/72, she has not taken BP today, waiting on pt to arrive, she will call back later today with the reading from today per noting the BP is coming down, pt family did not have any readings prior to 09-09-12,

## 2012-09-15 NOTE — Telephone Encounter (Signed)
Please advise, no instructions noted in routed message

## 2012-09-16 NOTE — Telephone Encounter (Signed)
.  left message to have patient return my call.  

## 2012-09-16 NOTE — Telephone Encounter (Signed)
If BP still high start clonidine 0.1mg  BID

## 2012-09-17 ENCOUNTER — Telehealth: Payer: Self-pay | Admitting: Cardiology

## 2012-09-17 MED ORDER — CLONIDINE HCL 0.1 MG PO TABS: 0.1000 mg | ORAL_TABLET | Freq: Two times a day (BID) | ORAL | Status: AC

## 2012-09-17 NOTE — Telephone Encounter (Signed)
BP reading for 5/28 per pt phone call... 147/67 per your request/ tgs

## 2012-09-17 NOTE — Telephone Encounter (Signed)
Spoke to pt to advise results/instructions. Pt understood. Per ML instructions to start on new medication, see phone note 09-10-12,new RX sent

## 2012-09-17 NOTE — Telephone Encounter (Signed)
Spoke to pt to advise results/instructions. Pt understood. Also advised pt daughter, new RX sent

## 2012-09-17 NOTE — Addendum Note (Signed)
Addended by: Derry Lory A on: 09/17/2012 09:44 AM   Modules accepted: Orders

## 2012-10-13 ENCOUNTER — Ambulatory Visit: Payer: Medicare Other | Admitting: Cardiology

## 2012-10-20 ENCOUNTER — Telehealth: Payer: Self-pay | Admitting: Family Medicine

## 2012-10-20 NOTE — Telephone Encounter (Signed)
Enc date 09/08/12 - letter printed & mailed 10/21/12

## 2012-11-04 ENCOUNTER — Ambulatory Visit: Payer: Medicare Other | Admitting: Family Medicine

## 2012-11-10 DIAGNOSIS — I129 Hypertensive chronic kidney disease with stage 1 through stage 4 chronic kidney disease, or unspecified chronic kidney disease: Secondary | ICD-10-CM

## 2012-11-10 DIAGNOSIS — I5031 Acute diastolic (congestive) heart failure: Secondary | ICD-10-CM

## 2012-11-10 DIAGNOSIS — I509 Heart failure, unspecified: Secondary | ICD-10-CM

## 2012-11-10 DIAGNOSIS — E119 Type 2 diabetes mellitus without complications: Secondary | ICD-10-CM

## 2012-11-15 ENCOUNTER — Encounter: Payer: Self-pay | Admitting: Family Medicine

## 2012-11-15 ENCOUNTER — Ambulatory Visit (INDEPENDENT_AMBULATORY_CARE_PROVIDER_SITE_OTHER): Payer: Medicare Other | Admitting: Family Medicine

## 2012-11-15 ENCOUNTER — Other Ambulatory Visit: Payer: Self-pay | Admitting: Family Medicine

## 2012-11-15 VITALS — BP 180/98 | Temp 98.0°F | Wt 167.1 lb

## 2012-11-15 DIAGNOSIS — B351 Tinea unguium: Secondary | ICD-10-CM

## 2012-11-15 DIAGNOSIS — R21 Rash and other nonspecific skin eruption: Secondary | ICD-10-CM

## 2012-11-15 MED ORDER — TRIAMCINOLONE ACETONIDE 0.1 % EX CREA: TOPICAL_CREAM | Freq: Two times a day (BID) | CUTANEOUS | Status: AC

## 2012-11-15 NOTE — Progress Notes (Signed)
  Subjective:    Patient ID: [name], male    DOB: 10-Aug-1933, 79 y.o.   MRN: [id number]  HPI Patient arrives to the office complaining of a sore on the foot. As it turns out his vision is not good. It is not truly a sore but rather a rash.  Patient also notes thickened nails which are starting to become uncomfortable. He is in no shape to trim them himself.  Patient also notes difficulty getting enough blood for his Accu-Chek of you've a machine. Would like an alternative.   Review of Systems No chest pain no shortness of breath no abdominal pain ROS otherwise negative    Objective:   Physical Exam  Alert no acute distress. Lungs clear. Heart regular in rhythm. HEENT normal. Feet reveals a patchy hypertrophic rash does not appear fungal, but rather like venous stasis dermatitis. Nails poor condition massively thickened      Assessment & Plan:  Impression rash #2 onychomycosis discussed #3 machine concerns. Plan change to one touch ultra light. Triamcinolone twice a day. Podiatry consult. WSL

## 2012-12-06 ENCOUNTER — Other Ambulatory Visit: Payer: Self-pay | Admitting: Family Medicine

## 2012-12-07 ENCOUNTER — Telehealth: Payer: Self-pay | Admitting: Family Medicine

## 2012-12-07 NOTE — Telephone Encounter (Signed)
Rx faxed to [facility]. Patient notified. 

## 2012-12-07 NOTE — Telephone Encounter (Signed)
Patient needs Rx for diabetic needles   [facility]

## 2012-12-10 ENCOUNTER — Other Ambulatory Visit: Payer: Self-pay | Admitting: Family Medicine

## 2012-12-21 ENCOUNTER — Other Ambulatory Visit: Payer: Self-pay | Admitting: Family Medicine

## 2012-12-30 ENCOUNTER — Telehealth: Payer: Self-pay | Admitting: Family Medicine

## 2012-12-30 NOTE — Telephone Encounter (Signed)
See chart for Personal Care Services form to be filled out by the Doc, thanks

## 2013-01-03 ENCOUNTER — Telehealth: Payer: Self-pay | Admitting: Family Medicine

## 2013-01-03 ENCOUNTER — Other Ambulatory Visit: Payer: Self-pay | Admitting: Family Medicine

## 2013-01-03 NOTE — Telephone Encounter (Signed)
Patients daughter calling to see if paperwork has been signed so he can get home health nurse.

## 2013-01-11 ENCOUNTER — Encounter: Payer: Self-pay | Admitting: *Deleted

## 2013-02-11 ENCOUNTER — Telehealth: Payer: Self-pay | Admitting: Family Medicine

## 2013-02-11 NOTE — Telephone Encounter (Signed)
Daughter states [name] needs a prescription for Diabetic Shoes sent to [facility].  Please call when and if we can send this over to [facility].

## 2013-02-14 ENCOUNTER — Telehealth: Payer: Self-pay | Admitting: Family Medicine

## 2013-02-14 NOTE — Telephone Encounter (Signed)
Gaston Islam at 02/11/2013 10:09 AM   Status: Signed            Daughter states [place] needs a prescription for Diabetic Shoes sent to [facility].  Please call when and if we can send this over to [facility].

## 2013-02-14 NOTE — Telephone Encounter (Signed)
Ok wis dx neuropathy diabetes

## 2013-02-14 NOTE — Telephone Encounter (Signed)
Patient daughter has called again this morning asking about the prescription for Diabetic shoes.  Please call to let her know if our office will be able to prescribe these for [name].

## 2013-02-15 ENCOUNTER — Encounter: Payer: Self-pay | Admitting: Family Medicine

## 2013-02-15 ENCOUNTER — Ambulatory Visit (INDEPENDENT_AMBULATORY_CARE_PROVIDER_SITE_OTHER): Payer: Medicare Other | Admitting: Family Medicine

## 2013-02-15 VITALS — BP 148/86 | Ht 71.0 in | Wt 166.0 lb

## 2013-02-15 DIAGNOSIS — Z79899 Other long term (current) drug therapy: Secondary | ICD-10-CM

## 2013-02-15 DIAGNOSIS — F05 Delirium due to known physiological condition: Secondary | ICD-10-CM

## 2013-02-15 DIAGNOSIS — R269 Unspecified abnormalities of gait and mobility: Secondary | ICD-10-CM

## 2013-02-15 DIAGNOSIS — E119 Type 2 diabetes mellitus without complications: Secondary | ICD-10-CM

## 2013-02-15 DIAGNOSIS — Z23 Encounter for immunization: Secondary | ICD-10-CM

## 2013-02-15 DIAGNOSIS — E782 Mixed hyperlipidemia: Secondary | ICD-10-CM

## 2013-02-15 LAB — POCT GLYCOSYLATED HEMOGLOBIN (HGB A1C): Hemoglobin A1C: 6.6

## 2013-02-15 NOTE — Telephone Encounter (Signed)
Rx written and given to patient

## 2013-02-15 NOTE — Progress Notes (Addendum)
  Subjective:    Patient ID: [name], male    DOB: 03/20/1934, 79 y.o.   MRN: [id number]  HPIBilateral leg and foot pain. Off and on for several months. Taking asa for pain.   Patient as well but he is a very very difficult historian. On further history he does not appear that leg pain is that much of a problem. The patient is having difficulty walking. He has a shuffling gait. He has unsteadiness at times. The family notes that he often starts moving quickly without being able to control his feet. There worried that he will fall. He's had no significant falls as of yet.  Though the patient has heart disease his pain is not really suggestive of claudication per se.  A1C today 6.6  Review of Systems No chest pain no back pain no abdominal pain no shortness of breath    Objective:   Physical Exam  Alert somewhat non-expressive. Vital stable. HEENT normal. Lungs clear. Heart regular in rhythm. No obvious tremor. Legs pulses diminished capillary refill somewhat slowed bilateral sensation not intact. Diminished sensation with callus formation Gait definitely shuffling and slow  question mild cogwheel rigidity     Assessment & Plan:  Impression progressive shuffling gait with some Parkinson-like features. #2 type 2 diabetes decent control. #3 hypertension stable #4 chronic renal insufficiency. #5 hyperlipidemia Plan neuro consult. CT scan of head. Rationale discussed 35 minutes spent most in discussion. Maintain other meds. Recheck in several months.

## 2013-02-21 ENCOUNTER — Ambulatory Visit (HOSPITAL_COMMUNITY)
Admission: RE | Admit: 2013-02-21 | Discharge: 2013-02-21 | Disposition: A | Discharge status: Home / Self Care | Payer: Medicare Other | Source: Ambulatory Visit | Attending: Family Medicine | Admitting: Family Medicine

## 2013-02-21 ENCOUNTER — Encounter (HOSPITAL_COMMUNITY): Payer: Self-pay

## 2013-02-21 DIAGNOSIS — G930 Cerebral cysts: Secondary | ICD-10-CM | POA: Insufficient documentation

## 2013-02-21 DIAGNOSIS — F05 Delirium due to known physiological condition: Secondary | ICD-10-CM | POA: Insufficient documentation

## 2013-02-21 DIAGNOSIS — G319 Degenerative disease of nervous system, unspecified: Secondary | ICD-10-CM | POA: Insufficient documentation

## 2013-02-22 ENCOUNTER — Ambulatory Visit (HOSPITAL_COMMUNITY): Payer: Medicare Other

## 2013-02-22 LAB — BASIC METABOLIC PANEL
BUN: 29 mg/dL — ABNORMAL HIGH (ref 6–23)
Calcium: 9.3 mg/dL (ref 8.4–10.5)
Glucose, Bld: 68 mg/dL — ABNORMAL LOW (ref 70–99)

## 2013-02-22 LAB — HEPATIC FUNCTION PANEL
ALT: <8 U/L (ref 0–53)
AST: 12 U/L (ref 0–37)
Bilirubin, Direct: 0.2 mg/dL (ref 0.0–0.3)
Indirect Bilirubin: 0.6 mg/dL (ref 0.0–0.9)

## 2013-02-22 LAB — LIPID PANEL: Cholesterol: 105 mg/dL (ref 0–200)

## 2013-02-22 LAB — MICROALBUMIN, URINE: Microalb, Ur: 33.13 mg/dL — ABNORMAL HIGH (ref 0.00–1.89)

## 2013-02-24 ENCOUNTER — Telehealth: Payer: Self-pay | Admitting: Family Medicine

## 2013-02-24 NOTE — Telephone Encounter (Signed)
Pt's insurance would not approve a CT of the head but did give authorization for MRI of brain.  Per Dr. Brett Canales, "Ok to change test ordered to MRI".  MRI ordered and done

## 2013-03-04 NOTE — Progress Notes (Signed)
Spoke with patient to tell him all blood work good. MRI showed old stroke but no new problems. Patient verbalized understanding.

## 2013-04-05 ENCOUNTER — Telehealth: Payer: Self-pay | Admitting: *Deleted

## 2013-04-05 NOTE — Telephone Encounter (Signed)
[facility]. Needs documentation of neuropathy and callus formation for insurance billing. See form on chart.

## 2013-04-06 NOTE — Telephone Encounter (Signed)
[facility] called to state that they need the documentation of his neuropathy & callus formation and they need a new Rx (order).  The order they have is dated before the office note.  The date on the Rx MUST BE on or after the date on the office note

## 2013-04-06 NOTE — Telephone Encounter (Signed)
I need pt's paper chart

## 2013-04-26 ENCOUNTER — Telehealth: Payer: Self-pay | Admitting: Family Medicine

## 2013-04-26 NOTE — Telephone Encounter (Signed)
i thought i wrote just yest on chart?

## 2013-04-26 NOTE — Telephone Encounter (Signed)
Patient needs Rx for diabetic shoes to [facility]

## 2013-04-26 NOTE — Telephone Encounter (Signed)
Patient notified

## 2013-05-03 ENCOUNTER — Ambulatory Visit: Payer: Medicare Other | Admitting: Family Medicine

## 2013-05-10 ENCOUNTER — Ambulatory Visit (INDEPENDENT_AMBULATORY_CARE_PROVIDER_SITE_OTHER): Payer: Medicare Other | Admitting: Family Medicine

## 2013-05-10 ENCOUNTER — Encounter: Payer: Self-pay | Admitting: Family Medicine

## 2013-05-10 VITALS — BP 190/110 | Ht 71.0 in | Wt 167.0 lb

## 2013-05-10 DIAGNOSIS — R269 Unspecified abnormalities of gait and mobility: Secondary | ICD-10-CM | POA: Diagnosis not present

## 2013-05-10 DIAGNOSIS — R2689 Other abnormalities of gait and mobility: Secondary | ICD-10-CM

## 2013-05-10 NOTE — Progress Notes (Signed)
   Subjective:    Patient ID: [name], male    DOB: [birth year], 80 y.o.   MRN: [id number]  HPI Patient is here today to get a prescription for diabetic shoes. They said that [facility] will not give them a reason as to why they will not give the shoes to the patient. I know that you have written at least 2 prescriptions for the shoes.  Last A1C was 10/28 and it was 6.6  Patient has a history of progressive tremor. Shuffling of gait. Difficulty walking. We had set him up with neurologist. Family had difficulty getting Medicaid card. Canceled appointment and needs to make another him.  Still having challenges getting his free shoes for his neuropathy and diabetes.  No major difficulty with compliance of medicines at this time per family.  Review of Systems No chest pain no headache no back pain no loss of consciousness no change in bowel habits ROS otherwise negative    Objective:   Physical Exam Alert no apparent distress. Slight tremor at rest. HEENT normal. Lungs clear. Heart regular rate and rhythm. Ankles without edema. Distal sensation diminished. Some callus formation. Pulses adequate.       Assessment & Plan:  Impression #1 type 2 diabetes control decent. #2 tremor and shuffling gait with inability to keep neurology referral last time. #3 diabetes with elements of the foot neuropathy. Patient needs his free shoes we will work on this again. Plan as per orders. Followup in several months. Neurology consult. Importance of compliance with medications and diet discussed at length. WSL

## 2013-05-17 ENCOUNTER — Telehealth: Payer: Self-pay | Admitting: Family Medicine

## 2013-05-17 NOTE — Telephone Encounter (Signed)
Will be done on thur--can pick up then

## 2013-05-17 NOTE — Telephone Encounter (Signed)
Calling to check on paperwork dropped off that is located on chart.

## 2013-05-17 NOTE — Telephone Encounter (Signed)
Left message notifying patient paperwork will be ready for pick up on Thursday.

## 2013-05-18 ENCOUNTER — Other Ambulatory Visit: Payer: Self-pay | Admitting: Family Medicine

## 2013-05-24 ENCOUNTER — Telehealth: Payer: Self-pay | Admitting: Family Medicine

## 2013-05-24 NOTE — Telephone Encounter (Signed)
Patient's one eye was red yesterday, now both are red today. He denies and itchiness, burning, or discharge. They are just red. No other symptoms.

## 2013-05-24 NOTE — Telephone Encounter (Signed)
If just red, hold off on rx

## 2013-05-24 NOTE — Telephone Encounter (Signed)
Patient woke up with his eye red today. Please advise.   [facility]

## 2013-05-24 NOTE — Telephone Encounter (Signed)
Discussed with patient and family member-patient verbalized understanding.

## 2013-06-22 ENCOUNTER — Ambulatory Visit (INDEPENDENT_AMBULATORY_CARE_PROVIDER_SITE_OTHER): Payer: Medicare Other | Admitting: Family Medicine

## 2013-06-22 ENCOUNTER — Encounter: Payer: Self-pay | Admitting: Family Medicine

## 2013-06-22 VITALS — BP 160/80 | Ht 71.0 in | Wt 164.0 lb

## 2013-06-22 DIAGNOSIS — R269 Unspecified abnormalities of gait and mobility: Secondary | ICD-10-CM

## 2013-06-22 DIAGNOSIS — R2689 Other abnormalities of gait and mobility: Secondary | ICD-10-CM

## 2013-06-22 IMAGING — CR DG CHEST 1V PORT
1 series · 1 of 1 positions shown · non-contrast
Comparison: 08/28/2012

CLINICAL DATA: Congestive heart failure and pneumonia.

PORTABLE CHEST - 1 VIEW

[AP]
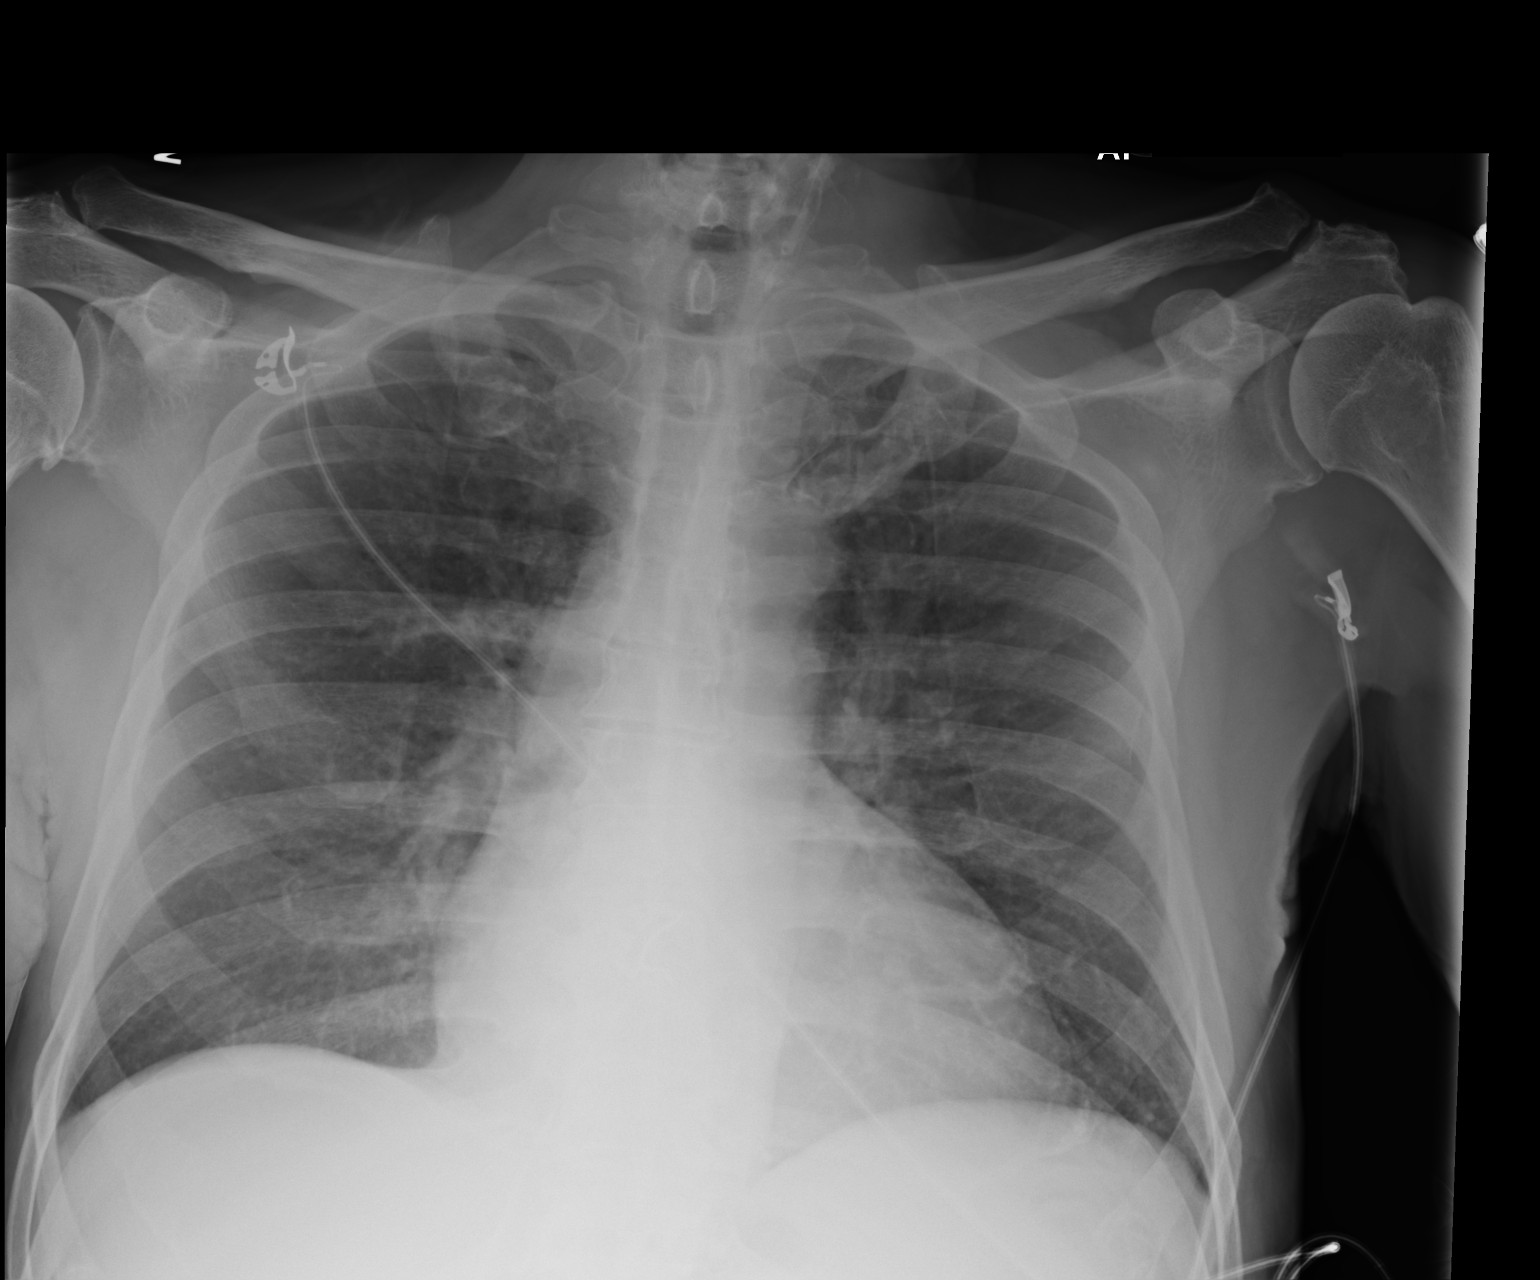

[1 of 1 positions shown; findings below may reference images not displayed]

FINDINGS: No residual infiltrate is identified.  No evidence of
pulmonary edema or pleural fluid.  Heart size is stable.
IMPRESSION: No active disease.

## 2013-06-22 NOTE — Progress Notes (Signed)
   Subjective:    Patient ID: [name], male    DOB: 1934/03/23, 80 y.o.   MRN: [id number]  HPI Patient is here today to discuss getting approved to receive a power wheelchair. (Paperwork on front of chart from [facility]) Patient states he has no other concerns or issues at this time during this visit.   Patient has difficulties getting around. He has a shuffling gait. We had set up a neurology consultation with him. Unfortunately the family did not keep that appointment. They are working on rescheduling that.  Review of Systems No chest pain no back pain no abdominal pain no change about habits no blood in stool ROS otherwise negative    Objective:   Physical Exam   Alert no apparent distress. Lungs clear. Heart regular in rhythm. Neuro exam intact. Ankles without edema.     Assessment & Plan:  Impression #1 shuffling gait with difficulty with ambulation to discussed plan regular wheelchair prescribed at this time. Not power chair. Followup at regular appointment. WSL

## 2013-07-20 DEATH — deceased

## 2013-08-08 ENCOUNTER — Ambulatory Visit: Payer: Medicare Other | Admitting: Family Medicine

## 2014-03-30 ENCOUNTER — Encounter (HOSPITAL_COMMUNITY): Payer: Self-pay | Admitting: Cardiovascular Disease

## 2019-05-26 ENCOUNTER — Encounter: Payer: Self-pay | Admitting: Family Medicine
